# Patient Record
Sex: Female | Born: 1955 | Hispanic: Yes | Marital: Single
Health system: Southern US, Community
[De-identification: ages and names within clinical notes are randomized; demographics above are authoritative.]

## PROBLEM LIST (undated history)

## (undated) VITALS — BP 96/45 | HR 68 | Temp 98.1°F | Resp 19 | Ht 63.98 in | Wt 169.1 lb

---

## 2004-08-27 ENCOUNTER — Ambulatory Visit: Payer: Self-pay | Admitting: General Practice

## 2005-10-01 ENCOUNTER — Ambulatory Visit: Payer: Self-pay | Admitting: General Practice

## 2009-06-06 ENCOUNTER — Inpatient Hospital Stay: Payer: Self-pay | Admitting: Internal Medicine

## 2009-08-05 ENCOUNTER — Inpatient Hospital Stay: Payer: Self-pay | Admitting: Internal Medicine

## 2009-08-16 ENCOUNTER — Inpatient Hospital Stay: Payer: Self-pay | Admitting: Internal Medicine

## 2009-09-06 ENCOUNTER — Ambulatory Visit: Payer: Self-pay | Admitting: Surgery

## 2010-05-13 ENCOUNTER — Emergency Department: Payer: Self-pay | Admitting: Emergency Medicine

## 2011-04-30 ENCOUNTER — Emergency Department: Payer: Self-pay | Admitting: Emergency Medicine

## 2011-04-30 LAB — COMPREHENSIVE METABOLIC PANEL
Albumin: 2.6 g/dL — ABNORMAL LOW (ref 3.4–5.0)
Alkaline Phosphatase: 180 U/L — ABNORMAL HIGH (ref 50–136)
Bilirubin,Total: 1.8 mg/dL — ABNORMAL HIGH (ref 0.2–1.0)
Calcium, Total: 7.8 mg/dL — ABNORMAL LOW (ref 8.5–10.1)
Chloride: 108 mmol/L — ABNORMAL HIGH (ref 98–107)
Creatinine: 0.74 mg/dL (ref 0.60–1.30)
EGFR (African American): >60
EGFR (Non-African Amer.): >60
Glucose: 123 mg/dL — ABNORMAL HIGH (ref 65–99)
Osmolality: 284 (ref 275–301)
Potassium: 4.2 mmol/L (ref 3.5–5.1)
SGOT(AST): 74 U/L — ABNORMAL HIGH (ref 15–37)
Sodium: 142 mmol/L (ref 136–145)
Total Protein: 5.6 g/dL — ABNORMAL LOW (ref 6.4–8.2)

## 2011-04-30 LAB — CBC
HCT: 34 % — ABNORMAL LOW (ref 35.0–47.0)
HGB: 12.1 g/dL (ref 12.0–16.0)
MCHC: 35.5 g/dL (ref 32.0–36.0)
RBC: 3.23 10*6/uL — ABNORMAL LOW (ref 3.80–5.20)
WBC: 4.3 10*3/uL (ref 3.6–11.0)

## 2011-04-30 LAB — TROPONIN I
Troponin-I: <0.02 ng/mL
Troponin-I: 0.02 ng/mL

## 2011-04-30 LAB — CK TOTAL AND CKMB (NOT AT ARMC): CK-MB: 1.1 ng/mL (ref 0.5–3.6)

## 2011-05-01 LAB — AMMONIA: Ammonia, Plasma: 48 mcmol/L — ABNORMAL HIGH (ref 11–32)

## 2012-11-15 ENCOUNTER — Emergency Department: Payer: Self-pay | Admitting: Emergency Medicine

## 2012-11-15 LAB — URINALYSIS, COMPLETE
Specific Gravity: 1.03 (ref 1.003–1.030)
WBC UR: 1 /HPF (ref 0–5)

## 2012-11-15 LAB — COMPREHENSIVE METABOLIC PANEL
Anion Gap: 4 — ABNORMAL LOW (ref 7–16)
BUN: 9 mg/dL (ref 7–18)
Calcium, Total: 7.7 mg/dL — ABNORMAL LOW (ref 8.5–10.1)
Chloride: 107 mmol/L (ref 98–107)
Co2: 26 mmol/L (ref 21–32)
EGFR (African American): >60
EGFR (Non-African Amer.): >60
Osmolality: 276 (ref 275–301)
SGOT(AST): 65 U/L — ABNORMAL HIGH (ref 15–37)
SGPT (ALT): 49 U/L (ref 12–78)
Sodium: 137 mmol/L (ref 136–145)

## 2012-11-15 LAB — CBC
HGB: 12.6 g/dL (ref 12.0–16.0)
MCH: 38.2 pg — ABNORMAL HIGH (ref 26.0–34.0)
MCHC: 35.6 g/dL (ref 32.0–36.0)
MCV: 108 fL — ABNORMAL HIGH (ref 80–100)
Platelet: 74 10*3/uL — ABNORMAL LOW (ref 150–440)
RDW: 15 % — ABNORMAL HIGH (ref 11.5–14.5)

## 2012-11-15 LAB — PROTIME-INR: INR: 1.6

## 2012-11-15 LAB — LIPASE, BLOOD: Lipase: 117 U/L (ref 73–393)

## 2012-11-25 ENCOUNTER — Ambulatory Visit: Payer: Self-pay | Admitting: Unknown Physician Specialty

## 2012-12-10 ENCOUNTER — Emergency Department: Payer: Self-pay | Admitting: Internal Medicine

## 2012-12-10 LAB — PROTIME-INR
INR: 1.7
Prothrombin Time: 20.2 secs — ABNORMAL HIGH (ref 11.5–14.7)

## 2012-12-10 LAB — BASIC METABOLIC PANEL
Anion Gap: 6 — ABNORMAL LOW (ref 7–16)
BUN: 7 mg/dL (ref 7–18)
Co2: 25 mmol/L (ref 21–32)
Creatinine: 0.73 mg/dL (ref 0.60–1.30)
EGFR (African American): >60
EGFR (Non-African Amer.): >60
Glucose: 92 mg/dL (ref 65–99)
Potassium: 3.6 mmol/L (ref 3.5–5.1)

## 2012-12-10 LAB — CK TOTAL AND CKMB (NOT AT ARMC)
CK, Total: 188 U/L (ref 21–215)
CK-MB: 1.1 ng/mL (ref 0.5–3.6)

## 2012-12-10 LAB — CBC
HCT: 33.5 % — ABNORMAL LOW (ref 35.0–47.0)
HGB: 11.8 g/dL — ABNORMAL LOW (ref 12.0–16.0)
MCHC: 35.1 g/dL (ref 32.0–36.0)
RBC: 3.13 10*6/uL — ABNORMAL LOW (ref 3.80–5.20)
RDW: 14.8 % — ABNORMAL HIGH (ref 11.5–14.5)
WBC: 4.2 10*3/uL (ref 3.6–11.0)

## 2012-12-10 LAB — TROPONIN I: Troponin-I: <0.02 ng/mL

## 2012-12-21 ENCOUNTER — Emergency Department: Payer: Self-pay | Admitting: Emergency Medicine

## 2012-12-21 LAB — CBC
HCT: 39.3 % (ref 35.0–47.0)
HGB: 13.7 g/dL (ref 12.0–16.0)
MCH: 37 pg — ABNORMAL HIGH (ref 26.0–34.0)
MCHC: 34.9 g/dL (ref 32.0–36.0)
MCV: 106 fL — ABNORMAL HIGH (ref 80–100)
RBC: 3.7 10*6/uL — ABNORMAL LOW (ref 3.80–5.20)
WBC: 6.4 10*3/uL (ref 3.6–11.0)

## 2012-12-21 LAB — COMPREHENSIVE METABOLIC PANEL
Alkaline Phosphatase: 303 U/L — ABNORMAL HIGH (ref 50–136)
Anion Gap: 7 (ref 7–16)
Bilirubin,Total: 3.6 mg/dL — ABNORMAL HIGH (ref 0.2–1.0)
Calcium, Total: 8.1 mg/dL — ABNORMAL LOW (ref 8.5–10.1)
Chloride: 102 mmol/L (ref 98–107)
Co2: 23 mmol/L (ref 21–32)
Creatinine: 1.17 mg/dL (ref 0.60–1.30)
EGFR (Non-African Amer.): 51 — ABNORMAL LOW
Osmolality: 270 (ref 275–301)
SGOT(AST): 95 U/L — ABNORMAL HIGH (ref 15–37)
Sodium: 132 mmol/L — ABNORMAL LOW (ref 136–145)
Total Protein: 6.8 g/dL (ref 6.4–8.2)

## 2012-12-21 LAB — URINALYSIS, COMPLETE
Bilirubin,UR: NEGATIVE
Blood: NEGATIVE
Glucose,UR: >=500 mg/dL (ref 0–75)
Ketone: NEGATIVE
Leukocyte Esterase: NEGATIVE
Ph: 6 (ref 4.5–8.0)
RBC,UR: 2 /HPF (ref 0–5)
Specific Gravity: 1.03 (ref 1.003–1.030)
Squamous Epithelial: 2
WBC UR: <1 /HPF (ref 0–5)

## 2012-12-21 LAB — LIPASE, BLOOD: Lipase: 203 U/L (ref 73–393)

## 2012-12-24 ENCOUNTER — Emergency Department: Payer: Self-pay | Admitting: Emergency Medicine

## 2012-12-24 LAB — CBC
HCT: 37.3 % (ref 35.0–47.0)
MCHC: 35.1 g/dL (ref 32.0–36.0)
MCV: 106 fL — ABNORMAL HIGH (ref 80–100)
RDW: 14.5 % (ref 11.5–14.5)
WBC: 5.8 10*3/uL (ref 3.6–11.0)

## 2012-12-24 LAB — URINALYSIS, COMPLETE
Glucose,UR: >=500 mg/dL (ref 0–75)
Leukocyte Esterase: NEGATIVE
Nitrite: NEGATIVE
Ph: 6 (ref 4.5–8.0)
RBC,UR: 3 /HPF (ref 0–5)
Specific Gravity: 1.018 (ref 1.003–1.030)
WBC UR: 2 /HPF (ref 0–5)

## 2012-12-24 LAB — COMPREHENSIVE METABOLIC PANEL
Albumin: 2.4 g/dL — ABNORMAL LOW (ref 3.4–5.0)
Anion Gap: 7 (ref 7–16)
BUN: 8 mg/dL (ref 7–18)
Calcium, Total: 8 mg/dL — ABNORMAL LOW (ref 8.5–10.1)
Chloride: 106 mmol/L (ref 98–107)
Creatinine: 0.98 mg/dL (ref 0.60–1.30)
EGFR (Non-African Amer.): >60
Potassium: 3.6 mmol/L (ref 3.5–5.1)
Sodium: 136 mmol/L (ref 136–145)

## 2013-04-05 ENCOUNTER — Emergency Department: Payer: Self-pay | Admitting: Emergency Medicine

## 2013-04-06 LAB — CBC WITH DIFFERENTIAL/PLATELET
BASOS PCT: 1.2 %
Basophil #: 0.1 10*3/uL (ref 0.0–0.1)
Eosinophil #: 0.1 10*3/uL (ref 0.0–0.7)
Eosinophil %: 2.2 %
HCT: 37.3 % (ref 35.0–47.0)
HGB: 12.8 g/dL (ref 12.0–16.0)
Lymphocyte #: 1 10*3/uL (ref 1.0–3.6)
Lymphocyte %: 18.7 %
MCH: 37 pg — ABNORMAL HIGH (ref 26.0–34.0)
MCHC: 34.2 g/dL (ref 32.0–36.0)
MCV: 108 fL — AB (ref 80–100)
MONO ABS: 1 x10 3/mm — AB (ref 0.2–0.9)
Monocyte %: 19.4 %
NEUTROS ABS: 3 10*3/uL (ref 1.4–6.5)
Neutrophil %: 58.5 %
PLATELETS: 65 10*3/uL — AB (ref 150–440)
RBC: 3.44 10*6/uL — ABNORMAL LOW (ref 3.80–5.20)
RDW: 15.5 % — ABNORMAL HIGH (ref 11.5–14.5)
WBC: 5.2 10*3/uL (ref 3.6–11.0)

## 2013-04-06 LAB — COMPREHENSIVE METABOLIC PANEL
ALT: 65 U/L (ref 12–78)
Albumin: 2.5 g/dL — ABNORMAL LOW (ref 3.4–5.0)
Alkaline Phosphatase: 368 U/L — ABNORMAL HIGH
Anion Gap: 4 — ABNORMAL LOW (ref 7–16)
BUN: 9 mg/dL (ref 7–18)
Bilirubin,Total: 2.8 mg/dL — ABNORMAL HIGH (ref 0.2–1.0)
CREATININE: 0.69 mg/dL (ref 0.60–1.30)
Calcium, Total: 7.9 mg/dL — ABNORMAL LOW (ref 8.5–10.1)
Chloride: 104 mmol/L (ref 98–107)
Co2: 29 mmol/L (ref 21–32)
EGFR (African American): >60
Glucose: 106 mg/dL — ABNORMAL HIGH (ref 65–99)
OSMOLALITY: 273 (ref 275–301)
Potassium: 4.5 mmol/L (ref 3.5–5.1)
SGOT(AST): 90 U/L — ABNORMAL HIGH (ref 15–37)
Sodium: 137 mmol/L (ref 136–145)
Total Protein: 6.4 g/dL (ref 6.4–8.2)

## 2013-04-06 LAB — MAGNESIUM: MAGNESIUM: 1.8 mg/dL

## 2013-04-06 LAB — CK: CK, Total: 183 U/L (ref 21–215)

## 2013-04-10 ENCOUNTER — Ambulatory Visit: Payer: Self-pay | Admitting: Unknown Physician Specialty

## 2013-04-11 ENCOUNTER — Ambulatory Visit: Payer: Self-pay | Admitting: Unknown Physician Specialty

## 2013-09-17 ENCOUNTER — Inpatient Hospital Stay: Payer: Self-pay | Admitting: Internal Medicine

## 2013-09-17 LAB — COMPREHENSIVE METABOLIC PANEL
ALBUMIN: 2.3 g/dL — AB (ref 3.4–5.0)
ALK PHOS: 393 U/L — AB
Anion Gap: 8 (ref 7–16)
BUN: 14 mg/dL (ref 7–18)
Bilirubin,Total: 3.6 mg/dL — ABNORMAL HIGH (ref 0.2–1.0)
CREATININE: 0.69 mg/dL (ref 0.60–1.30)
Calcium, Total: 7.7 mg/dL — ABNORMAL LOW (ref 8.5–10.1)
Chloride: 104 mmol/L (ref 98–107)
Co2: 21 mmol/L (ref 21–32)
EGFR (African American): >60
EGFR (Non-African Amer.): >60
Glucose: 133 mg/dL — ABNORMAL HIGH (ref 65–99)
Osmolality: 269 (ref 275–301)
Potassium: 4.6 mmol/L (ref 3.5–5.1)
SGOT(AST): 92 U/L — ABNORMAL HIGH (ref 15–37)
SGPT (ALT): 72 U/L (ref 12–78)
Sodium: 133 mmol/L — ABNORMAL LOW (ref 136–145)
TOTAL PROTEIN: 6.1 g/dL — AB (ref 6.4–8.2)

## 2013-09-17 LAB — URINALYSIS, COMPLETE
BLOOD: NEGATIVE
Glucose,UR: 50 mg/dL (ref 0–75)
Leukocyte Esterase: NEGATIVE
NITRITE: NEGATIVE
PH: 5 (ref 4.5–8.0)
Protein: 30
Specific Gravity: 1.035 (ref 1.003–1.030)

## 2013-09-17 LAB — ACETAMINOPHEN LEVEL: Acetaminophen: <2 ug/mL

## 2013-09-17 LAB — AMMONIA: AMMONIA, PLASMA: 70 umol/L — AB (ref 11–32)

## 2013-09-17 LAB — ETHANOL
Ethanol %: <0.003 % (ref 0.000–0.080)
Ethanol: <3 mg/dL

## 2013-09-17 LAB — CBC
HCT: 37.6 % (ref 35.0–47.0)
HGB: 12.8 g/dL (ref 12.0–16.0)
MCH: 37.7 pg — ABNORMAL HIGH (ref 26.0–34.0)
MCHC: 34 g/dL (ref 32.0–36.0)
MCV: 111 fL — ABNORMAL HIGH (ref 80–100)
Platelet: 65 10*3/uL — ABNORMAL LOW (ref 150–440)
RBC: 3.39 10*6/uL — AB (ref 3.80–5.20)
RDW: 15.5 % — ABNORMAL HIGH (ref 11.5–14.5)
WBC: 10.8 10*3/uL (ref 3.6–11.0)

## 2013-09-17 LAB — SALICYLATE LEVEL: Salicylates, Serum: <1.7 mg/dL

## 2013-09-17 LAB — LIPASE, BLOOD: Lipase: 173 U/L (ref 73–393)

## 2013-09-18 LAB — CBC WITH DIFFERENTIAL/PLATELET
BASOS PCT: 0.4 %
Basophil #: 0 10*3/uL (ref 0.0–0.1)
Eosinophil #: 0.1 10*3/uL (ref 0.0–0.7)
Eosinophil %: 1.4 %
HCT: 30.7 % — AB (ref 35.0–47.0)
HGB: 10.8 g/dL — ABNORMAL LOW (ref 12.0–16.0)
LYMPHS ABS: 0.7 10*3/uL — AB (ref 1.0–3.6)
Lymphocyte %: 10.3 %
MCH: 39.5 pg — AB (ref 26.0–34.0)
MCHC: 35.1 g/dL (ref 32.0–36.0)
MCV: 113 fL — AB (ref 80–100)
MONO ABS: 1 x10 3/mm — AB (ref 0.2–0.9)
MONOS PCT: 14.6 %
NEUTROS ABS: 5 10*3/uL (ref 1.4–6.5)
NEUTROS PCT: 73.3 %
PLATELETS: 43 10*3/uL — AB (ref 150–440)
RBC: 2.72 10*6/uL — ABNORMAL LOW (ref 3.80–5.20)
RDW: 15.7 % — ABNORMAL HIGH (ref 11.5–14.5)
WBC: 6.8 10*3/uL (ref 3.6–11.0)

## 2013-09-18 LAB — COMPREHENSIVE METABOLIC PANEL
ANION GAP: 6 — AB (ref 7–16)
Albumin: 1.8 g/dL — ABNORMAL LOW (ref 3.4–5.0)
Alkaline Phosphatase: 190 U/L — ABNORMAL HIGH
BILIRUBIN TOTAL: 4.7 mg/dL — AB (ref 0.2–1.0)
BUN: 11 mg/dL (ref 7–18)
CALCIUM: 7.5 mg/dL — AB (ref 8.5–10.1)
CHLORIDE: 111 mmol/L — AB (ref 98–107)
CO2: 25 mmol/L (ref 21–32)
Creatinine: 0.78 mg/dL (ref 0.60–1.30)
EGFR (African American): >60
EGFR (Non-African Amer.): >60
Glucose: 115 mg/dL — ABNORMAL HIGH (ref 65–99)
OSMOLALITY: 283 (ref 275–301)
POTASSIUM: 4.1 mmol/L (ref 3.5–5.1)
SGOT(AST): 70 U/L — ABNORMAL HIGH (ref 15–37)
SGPT (ALT): 59 U/L (ref 12–78)
SODIUM: 142 mmol/L (ref 136–145)
Total Protein: 4.8 g/dL — ABNORMAL LOW (ref 6.4–8.2)

## 2013-09-18 LAB — MAGNESIUM: MAGNESIUM: 1.7 mg/dL — AB

## 2013-09-19 LAB — VANCOMYCIN, TROUGH: Vancomycin, Trough: 9 ug/mL — ABNORMAL LOW (ref 10–20)

## 2013-09-20 DIAGNOSIS — I519 Heart disease, unspecified: Secondary | ICD-10-CM

## 2013-09-20 LAB — CBC WITH DIFFERENTIAL/PLATELET
BASOS PCT: 0.2 %
Basophil #: 0 10*3/uL (ref 0.0–0.1)
Basophil #: 0.1 10*3/uL (ref 0.0–0.1)
Basophil %: 0.9 %
EOS PCT: 1.8 %
Eosinophil #: 0 10*3/uL (ref 0.0–0.7)
Eosinophil #: 0.1 10*3/uL (ref 0.0–0.7)
Eosinophil %: 0.2 %
HCT: 27.4 % — AB (ref 35.0–47.0)
HCT: 24.7 % — ABNORMAL LOW (ref 35.0–47.0)
HGB: 9.3 g/dL — AB (ref 12.0–16.0)
HGB: 8.4 g/dL — AB (ref 12.0–16.0)
LYMPHS ABS: 1.1 10*3/uL (ref 1.0–3.6)
LYMPHS ABS: 1.2 10*3/uL (ref 1.0–3.6)
LYMPHS PCT: 13.6 %
Lymphocyte %: 14.5 %
MCH: 38.3 pg — AB (ref 26.0–34.0)
MCH: 37.9 pg — ABNORMAL HIGH (ref 26.0–34.0)
MCHC: 34 g/dL (ref 32.0–36.0)
MCHC: 33.8 g/dL (ref 32.0–36.0)
MCV: 113 fL — ABNORMAL HIGH (ref 80–100)
MCV: 112 fL — ABNORMAL HIGH (ref 80–100)
MONO ABS: 1.4 x10 3/mm — AB (ref 0.2–0.9)
MONOS PCT: 17.5 %
Monocyte #: 1.1 x10 3/mm — ABNORMAL HIGH (ref 0.2–0.9)
Monocyte %: 13.7 %
NEUTROS PCT: 69.1 %
Neutrophil #: 5.3 10*3/uL (ref 1.4–6.5)
Neutrophil #: 5.6 10*3/uL (ref 1.4–6.5)
Neutrophil %: 68.5 %
PLATELETS: 88 10*3/uL — AB (ref 150–440)
Platelet: 85 10*3/uL — ABNORMAL LOW (ref 150–440)
RBC: 2.44 10*6/uL — ABNORMAL LOW (ref 3.80–5.20)
RBC: 2.2 10*6/uL — ABNORMAL LOW (ref 3.80–5.20)
RDW: 15.2 % — ABNORMAL HIGH (ref 11.5–14.5)
RDW: 15.8 % — ABNORMAL HIGH (ref 11.5–14.5)
WBC: 7.8 10*3/uL (ref 3.6–11.0)
WBC: 8.1 10*3/uL (ref 3.6–11.0)

## 2013-09-20 LAB — BASIC METABOLIC PANEL
ANION GAP: 6 — AB (ref 7–16)
BUN: 23 mg/dL — ABNORMAL HIGH (ref 7–18)
CALCIUM: 7.5 mg/dL — AB (ref 8.5–10.1)
CHLORIDE: 108 mmol/L — AB (ref 98–107)
Co2: 27 mmol/L (ref 21–32)
Creatinine: 0.81 mg/dL (ref 0.60–1.30)
EGFR (Non-African Amer.): >60
Glucose: 178 mg/dL — ABNORMAL HIGH (ref 65–99)
OSMOLALITY: 289 (ref 275–301)
Potassium: 4.8 mmol/L (ref 3.5–5.1)
Sodium: 141 mmol/L (ref 136–145)

## 2013-09-20 LAB — PROTIME-INR
INR: 1.9
PROTHROMBIN TIME: 21.3 s — AB (ref 11.5–14.7)

## 2013-09-20 LAB — RAPID HIV SCREEN (HIV 1/2 AB+AG)

## 2013-09-20 LAB — HEMOGLOBIN: HGB: 9.4 g/dL — ABNORMAL LOW (ref 12.0–16.0)

## 2013-09-21 LAB — HEMOGLOBIN
HGB: 7.4 g/dL — AB (ref 12.0–16.0)
HGB: 6.9 g/dL — ABNORMAL LOW (ref 12.0–16.0)

## 2013-09-21 LAB — CULTURE, BLOOD (SINGLE)

## 2013-09-22 LAB — BASIC METABOLIC PANEL
Anion Gap: 6 — ABNORMAL LOW (ref 7–16)
BUN: 13 mg/dL (ref 7–18)
CO2: 24 mmol/L (ref 21–32)
CREATININE: 0.73 mg/dL (ref 0.60–1.30)
Calcium, Total: 6.6 mg/dL — CL (ref 8.5–10.1)
Chloride: 111 mmol/L — ABNORMAL HIGH (ref 98–107)
EGFR (African American): >60
EGFR (Non-African Amer.): >60
GLUCOSE: 93 mg/dL (ref 65–99)
Osmolality: 281 (ref 275–301)
POTASSIUM: 3.9 mmol/L (ref 3.5–5.1)
Sodium: 141 mmol/L (ref 136–145)

## 2013-09-22 LAB — CBC WITH DIFFERENTIAL/PLATELET
Basophil #: 0 10*3/uL (ref 0.0–0.1)
Basophil %: 0.9 %
EOS PCT: 4.1 %
Eosinophil #: 0.1 10*3/uL (ref 0.0–0.7)
HCT: 22.9 % — ABNORMAL LOW (ref 35.0–47.0)
HGB: 7.9 g/dL — AB (ref 12.0–16.0)
LYMPHS PCT: 29 %
Lymphocyte #: 0.9 10*3/uL — ABNORMAL LOW (ref 1.0–3.6)
MCH: 37.9 pg — ABNORMAL HIGH (ref 26.0–34.0)
MCHC: 34.5 g/dL (ref 32.0–36.0)
MCV: 110 fL — ABNORMAL HIGH (ref 80–100)
Monocyte #: 0.6 x10 3/mm (ref 0.2–0.9)
Monocyte %: 18.9 %
Neutrophil #: 1.5 10*3/uL (ref 1.4–6.5)
Neutrophil %: 47.1 %
Platelet: 43 10*3/uL — ABNORMAL LOW (ref 150–440)
RBC: 2.08 10*6/uL — ABNORMAL LOW (ref 3.80–5.20)
RDW: 19.2 % — ABNORMAL HIGH (ref 11.5–14.5)
WBC: 3.3 10*3/uL — ABNORMAL LOW (ref 3.6–11.0)

## 2013-09-22 LAB — HEPATIC FUNCTION PANEL A (ARMC)
Albumin: 1.5 g/dL — ABNORMAL LOW (ref 3.4–5.0)
Alkaline Phosphatase: 126 U/L — ABNORMAL HIGH
Bilirubin, Direct: 1.7 mg/dL — ABNORMAL HIGH (ref 0.00–0.20)
Bilirubin,Total: 3 mg/dL — ABNORMAL HIGH (ref 0.2–1.0)
SGOT(AST): 92 U/L — ABNORMAL HIGH (ref 15–37)
SGPT (ALT): 61 U/L (ref 12–78)
Total Protein: 3.8 g/dL — ABNORMAL LOW (ref 6.4–8.2)

## 2013-09-22 LAB — HEMOGLOBIN: HGB: 9.1 g/dL — ABNORMAL LOW (ref 12.0–16.0)

## 2013-09-24 LAB — COMPREHENSIVE METABOLIC PANEL
ALK PHOS: 151 U/L — AB
ALT: 60 U/L (ref 12–78)
Albumin: 1.7 g/dL — ABNORMAL LOW (ref 3.4–5.0)
Anion Gap: 6 — ABNORMAL LOW (ref 7–16)
BUN: 13 mg/dL (ref 7–18)
Bilirubin,Total: 2.5 mg/dL — ABNORMAL HIGH (ref 0.2–1.0)
CALCIUM: 7 mg/dL — AB (ref 8.5–10.1)
CO2: 25 mmol/L (ref 21–32)
Chloride: 111 mmol/L — ABNORMAL HIGH (ref 98–107)
Creatinine: 0.82 mg/dL (ref 0.60–1.30)
EGFR (Non-African Amer.): >60
GLUCOSE: 82 mg/dL (ref 65–99)
Osmolality: 282 (ref 275–301)
Potassium: 3.8 mmol/L (ref 3.5–5.1)
SGOT(AST): 102 U/L — ABNORMAL HIGH (ref 15–37)
SODIUM: 142 mmol/L (ref 136–145)
Total Protein: 4.3 g/dL — ABNORMAL LOW (ref 6.4–8.2)

## 2013-09-24 LAB — CBC WITH DIFFERENTIAL/PLATELET
BASOS ABS: 0.1 10*3/uL (ref 0.0–0.1)
BASOS PCT: 1.2 %
Eosinophil #: 0.2 10*3/uL (ref 0.0–0.7)
Eosinophil %: 3.9 %
HCT: 26.2 % — ABNORMAL LOW (ref 35.0–47.0)
HGB: 9 g/dL — ABNORMAL LOW (ref 12.0–16.0)
Lymphocyte #: 1.4 10*3/uL (ref 1.0–3.6)
Lymphocyte %: 29.9 %
MCH: 37.4 pg — ABNORMAL HIGH (ref 26.0–34.0)
MCHC: 34.5 g/dL (ref 32.0–36.0)
MCV: 108 fL — AB (ref 80–100)
MONO ABS: 0.8 x10 3/mm (ref 0.2–0.9)
Monocyte %: 18.4 %
Neutrophil #: 2.1 10*3/uL (ref 1.4–6.5)
Neutrophil %: 46.6 %
Platelet: 67 10*3/uL — ABNORMAL LOW (ref 150–440)
RBC: 2.42 10*6/uL — AB (ref 3.80–5.20)
RDW: 18.7 % — ABNORMAL HIGH (ref 11.5–14.5)
WBC: 4.6 10*3/uL (ref 3.6–11.0)

## 2013-09-24 LAB — CULTURE, BLOOD (SINGLE)

## 2013-09-25 LAB — CBC WITH DIFFERENTIAL/PLATELET
Basophil #: 0.1 10*3/uL (ref 0.0–0.1)
Basophil %: 1.1 %
EOS PCT: 4.1 %
Eosinophil #: 0.2 10*3/uL (ref 0.0–0.7)
HCT: 26.8 % — AB (ref 35.0–47.0)
HGB: 9.2 g/dL — ABNORMAL LOW (ref 12.0–16.0)
LYMPHS PCT: 23.5 %
Lymphocyte #: 1.1 10*3/uL (ref 1.0–3.6)
MCH: 37.5 pg — ABNORMAL HIGH (ref 26.0–34.0)
MCHC: 34.3 g/dL (ref 32.0–36.0)
MCV: 110 fL — ABNORMAL HIGH (ref 80–100)
MONO ABS: 1 x10 3/mm — AB (ref 0.2–0.9)
MONOS PCT: 20.8 %
Neutrophil #: 2.3 10*3/uL (ref 1.4–6.5)
Neutrophil %: 50.5 %
PLATELETS: 72 10*3/uL — AB (ref 150–440)
RBC: 2.45 10*6/uL — ABNORMAL LOW (ref 3.80–5.20)
RDW: 18.9 % — AB (ref 11.5–14.5)
WBC: 4.6 10*3/uL (ref 3.6–11.0)

## 2013-09-26 ENCOUNTER — Ambulatory Visit (HOSPITAL_COMMUNITY)
Admission: AD | Admit: 2013-09-26 | Discharge: 2013-09-26 | Disposition: A | Discharge status: Home / Self Care | Payer: Self-pay | Source: Other Acute Inpatient Hospital | Attending: Internal Medicine | Admitting: Internal Medicine

## 2013-09-26 DIAGNOSIS — A419 Sepsis, unspecified organism: Secondary | ICD-10-CM | POA: Insufficient documentation

## 2014-06-27 ENCOUNTER — Emergency Department
Admit: 2014-06-27 | Disposition: A | Discharge status: Other Acute Inpatient Hospital | Payer: Self-pay | Admitting: Emergency Medicine

## 2014-06-27 LAB — COMPREHENSIVE METABOLIC PANEL
ALBUMIN: 2.4 g/dL — AB
ALT: 54 U/L
Alkaline Phosphatase: 238 U/L — ABNORMAL HIGH
Anion Gap: 7 (ref 7–16)
BUN: 22 mg/dL — ABNORMAL HIGH
Bilirubin,Total: 3.4 mg/dL — ABNORMAL HIGH
CHLORIDE: 101 mmol/L
Calcium, Total: 8.3 mg/dL — ABNORMAL LOW
Co2: 25 mmol/L
Creatinine: 1.08 mg/dL — ABNORMAL HIGH
EGFR (African American): >60
EGFR (Non-African Amer.): 56 — ABNORMAL LOW
GLUCOSE: 126 mg/dL — AB
Potassium: 3.8 mmol/L
SGOT(AST): 79 U/L — ABNORMAL HIGH
SODIUM: 133 mmol/L — AB
Total Protein: 6.3 g/dL — ABNORMAL LOW

## 2014-06-27 LAB — CBC
HCT: 33.1 % — AB (ref 35.0–47.0)
HGB: 11 g/dL — AB (ref 12.0–16.0)
MCH: 32.4 pg (ref 26.0–34.0)
MCHC: 33.3 g/dL (ref 32.0–36.0)
MCV: 97 fL (ref 80–100)
Platelet: 53 10*3/uL — ABNORMAL LOW (ref 150–440)
RBC: 3.4 10*6/uL — AB (ref 3.80–5.20)
RDW: 19.6 % — ABNORMAL HIGH (ref 11.5–14.5)
WBC: 6.9 10*3/uL (ref 3.6–11.0)

## 2014-06-27 LAB — URINALYSIS, COMPLETE
BACTERIA: NONE SEEN
BILIRUBIN, UR: NEGATIVE
GLUCOSE, UR: NEGATIVE mg/dL (ref 0–75)
KETONE: NEGATIVE
Leukocyte Esterase: NEGATIVE
Nitrite: NEGATIVE
Ph: 6 (ref 4.5–8.0)
Protein: NEGATIVE
Specific Gravity: 1.021 (ref 1.003–1.030)

## 2014-06-27 LAB — APTT: Activated PTT: 30.3 secs (ref 23.6–35.9)

## 2014-06-27 LAB — ETHANOL: Ethanol: <5 mg/dL

## 2014-06-27 LAB — PROTIME-INR
INR: 1.5
PROTHROMBIN TIME: 18.3 s — AB

## 2014-06-27 LAB — TROPONIN I: Troponin-I: <0.03 ng/mL

## 2014-06-27 LAB — AMMONIA: AMMONIA, PLASMA: 73 umol/L — AB

## 2014-07-02 LAB — CULTURE, BLOOD (SINGLE)

## 2014-07-07 NOTE — Consult Note (Signed)
Pt with acute onset chills, nausea, vomiting, presented to ER and clinical dx made of possible SBP given her cirrhosis and ascites.  She was started on iv 3rd generation cephalosporin and now feels much better.  Would send her home on antibiotics when discharged.  No new suggestions.  Electronic Signatures: Scot JunElliott, Robert T (MD)  (Signed on 05-Jul-15 13:33)  Authored  Last Updated: 05-Jul-15 13:33 by Scot JunElliott, Robert T (MD)

## 2014-07-07 NOTE — H&P (Signed)
PATIENT NAME:  Annette Howard, Annette Howard MR#:  [id number] DATE OF BIRTH:  [birth year]  DATE OF ADMISSION:  09/17/2013.  PRIMARY CARE PHYSICIAN:  Nonlocal.   REFERRING PHYSICIAN:  Jade J. Dolores Frame, MD.  CHIEF COMPLAINT: Fever, body aches, vomiting, myalgias and epigastric pain.   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  She is presenting to the ED with a chief complaint of feeling sick, associated with chills and rigors and myalgias. The patient is also complaining of generalized abdominal pain, mostly located in the epigastric region, associated with clear liquid vomiting. She denies any hematemesis or melena. Denies any diarrhea. No sick people around.  Denies any similar complaints in the past. The patient had a paracentesis done in the past but she could not recall when it was done. The patient is brought into the ED, her ammonia levels are elevated. Lactulose was given and there was a concern about spontaneous bacterial peritonitis as the patient has liver cirrhosis along with some myalgias and elevated lactic acid, complaining of epigastric abdominal pain associated with vomiting.   Blood cultures were obtained and IV antibiotics were given.  The hospitalist team was called to admit the patient. With the help of a Spanish interpreter, Annette Howard, history was obtained from the patient. Her son is at the bedside. The patient is not confused and answering all questions accurately. One dose of lactulose was given in the ED.   PAST MEDICAL HISTORY: Chronic cirrhosis and anemia. Chronic neck pain. Gastric ulcers. Chronic anemia.   PAST SURGICAL HISTORY: Total hysterectomy and eye surgery.    ALLERGIES:  No known drug allergies.   PSYCHOSOCIAL HISTORY: Lives at home with her son. No history of smoking, alcohol or illicit drug usage.  HOME  MEDICATIONS: Spironolactone 50 mg once daily, oxycodone 5 mg 1 tablet p.o. q.4-6 hours.  FAMILY HISTORY: Parents were healthy, no significant medical problems.    REVIEW OF SYSTEMS:   CONSTITUTIONAL:  Complaining of, chills, rigors, and body aches, no weight loss or weight gain.  EYES: Denies blurry vision, double vision.  Positive jaundice, +2. No cataracts or glaucoma.  ENT: Denies any epistaxis, discharge, tinnitus.  RESPIRATORY:  Denies cough, COPD, no shortness of breath.  CARDIOVASCULAR: Denies any chest pain, palpitations, dizziness. GASTROINTESTINAL:  Complaining of nausea and vomiting x4, denies any hematemesis or melena. Complaining of epigastric abdominal pain.  NEUROLOGICAL: Denies any vertigo, ataxia, strokes or TIAs.   ENDOCRINE: Denies polyuria, nocturia, thyroid problems.  HEMATOLOGIC AND LYMPHATIC:  Has chronic anemia. Denies any bruising or bleeding.   MUSCULOSKELETAL: Denies any back pain. Complaining of chronic neck pain. Denies any shoulder pain or gout.  PSYCHIATRIC: Flat mood and affect.    PHYSICAL EXAMINATION:  VITAL SIGNS: Temperature 99.2, pulse 106, respirations 20, blood pressure 134/52, oxygen saturation is 98%.  GENERAL APPEARANCE: Not in acute distress. Moderately built and nourished.  HEENT: Normocephalic, atraumatic. Pupils are equal, reacting to light and accommodation. Positive scleral icterus. No conjunctival injection. Extraocular movements are intact. No sinus tenderness. Moist mucous membranes.  NECK: Supple. No JVD. No thyromegaly. Range of motion is intact. No meningeal signs.  LUNGS: Clear to auscultation bilaterally. No accessory muscle use and no anterior chest wall tenderness on palpation.  CARDIAC: S1 and S2 normal. Regular rate and rhythm, tachycardic.  GASTROINTESTINAL: Soft. Bowel sounds are positive in all 4 quadrants. Positive epigastric tenderness.  No rebound tenderness. No masses felt.  NEUROLOGIC: Awake, alert and oriented times x3.  Cranial nerves II-XII are grossly intact. Motor and sensory are intact. Reflexes are 2+.  EXTREMITIES: No edema. No cyanosis. No clubbing.  SKIN: Warm to touch. Normal turgor. No rashes. No lesions.  MUSCULOSKELETAL: No joint effusion, tenderness, erythema.  PSYCHIATRIC: Normal mood and affect.   LABORATORY AND IMAGING STUDIES:  A 12-lead EKG: Sinus tachycardia at 111 beats per minute, left atrial enlargement.    LFTs: Total protein 6.1, albumin 2.3, bilirubin total 3.6, alkaline phosphatase 93, AST 92, ALT 72, glucose 133, BUN and creatinine are normal. Sodium 133 potassium chloride and CO2 are normal. GFR greater than 60. Anion gap is 8. Serum osmolality is normal, calcium 7.7, lipase 173, ammonia level is 70. Serum alcohol level is less than 3. WBC 10.8, hemoglobin 12.8, hematocrit is 37.6, platelets are 265,000, MCV 111.   Urinalysis: Amber color, hazy in appearance. Bilirubin 1+, nitrites and leukocyte esterase are negative. Acetaminophen level is less than 2,  salicylate less than 1.7, lactic acid 1.4.   Portable chest x-ray: Slight right-sided pleural effusion which could obscure atelectasis or pneumonia.   ASSESSMENT AND PLAN: At 59 year old Hispanic female who came into the ED with a chief complaint of chills and rigors, nausea and vomiting associated with muscle aches and feeling sick.  She will be admitted with the following assessment and plan:  1.  Sepsis with elevated lactic acid, possibly spontaneous bacterial peritonitis. Pancultures were obtained, IV levofloxacin was given in the ED. Gastroenterology consult was placed. We will continue cefotaxime 2 grams IV q.8 hours for empiric coverage for spontaneous bacterial peritonitis.  Will provide the patient a clear liquid diet and pain management on an as-needed basis.  Deferring paracentesis based on GI recommendations.  2.  History of gastric ulcer. We will provide ranitidine.  The patient is on clear liquid diet at this time.  3.   Chronic history of cirrhosis, probably from hepatitis C, elevated ammonia level, but the patient's mentation is normal. We will provide lactulose.  4.  Chronic history of anemia and thrombocytopenia.  Will avoid antiplatelet agents. We will provide her with SCDs for deep vein thrombosis prophylaxis. 5.  Chronic neck pain. No photophobia or meningeal signs.  Range of motion of the neck is intact. Will provide pain management on as-needed basis. Provide GI and DVT prophylaxis with ranitidine and SCDs.   CODE STATUS:  Full code.  Her son is her medical power of attorney.   Her history and physical and physical examination was done with the help of Spanish interpreter Ms. Trudy.  Plan of care discussed with the patient and her son at bedside. They both verbalized understanding of the plan.   TOTAL TIME SPENT ON ADMISSION: 50 minutes.    ____________________________ Ramonita LabAruna Leon Goodnow, MD ag:lt D: 09/17/2013 08:00:19 ET T: 09/17/2013 09:00:56 ET JOB#: [id number]  cc: Ramonita LabAruna Mylisa Brunson, MD, <Dictator> Ramonita LabARUNA Zaila Crew MD ELECTRONICALLY SIGNED 09/30/2013 2:18

## 2014-07-07 NOTE — Consult Note (Signed)
Chief Complaint:  Subjective/Chief Complaint Patient without any complaints today. Denies any sign of further bleeding. Denies any abd pain.   Brief Assessment:  GEN well developed, well nourished, no acute distress, Jaundice   Cardiac Regular   Respiratory normal resp effort   Gastrointestinal Normal   Lab Results: Hepatic:  10-Jul-15 04:19   Bilirubin, Total  3.0  Bilirubin, Direct  1.7 (Result(s) reported on 22 Sep 2013 at 07:23AM.)  Alkaline Phosphatase  126 (45-117 NOTE: New Reference Range 02/03/13)  SGPT (ALT) 61  SGOT (AST)  92  Total Protein, Serum  3.8  Albumin, Serum  1.5  Routine Chem:  10-Jul-15 04:19   Glucose, Serum 93  BUN 13  Creatinine (comp) 0.73  Sodium, Serum 141  Potassium, Serum 3.9  Chloride, Serum  111  CO2, Serum 24  Calcium (Total), Serum  6.6  Anion Gap  6  Osmolality (calc) 281  eGFR (African American) >60  Result Comment CALCIUM - RESULTS VERIFIED BY REPEAT TESTING.  - NOTIFIED OF CRITICAL VALUE  - C/ TERESA TAPP $[id number]'[id number]$  09-22-13.Marland KitchenAJO  - READ-BACK PROCESS PERFORMED. LABS - This specimen was collected through an   - indwelling catheter or arterial line.  - A minimum of 70mls of blood was wasted prior    - to collecting the sample.  Interpret  - results with caution.  Result(s) reported on 22 Sep 2013 at 04:58AM.  Routine Hem:  10-Jul-15 04:19   Hemoglobin (CBC)  7.9  WBC (CBC)  3.3  RBC (CBC)  2.08  Hematocrit (CBC)  22.9  Platelet Count (CBC)  43  MCV  110  MCH  37.9  MCHC 34.5  RDW  19.2  Neutrophil % 47.1  Lymphocyte % 29.0  Monocyte % 18.9  Eosinophil % 4.1  Basophil % 0.9  Neutrophil # 1.5  Lymphocyte #  0.9  Monocyte # 0.6  Eosinophil # 0.1  Basophil # 0.0 (Result(s) reported on 22 Sep 2013 at 04:58AM.)    12:34   Hemoglobin (CBC)  9.1 (Result(s) reported on 22 Sep 2013 at 01:00PM.)   Assessment/Plan:  Assessment/Plan:  Assessment Hep C cirrhosis with variceal bleeding. Hb stable. No sign of any active  bleeding.   Plan Continue supportive care. Follow Hb.   Electronic Signatures: Lucilla Lame (MD)  (Signed 11-Jul-15 09:33)  Authored: Chief Complaint, Brief Assessment, Lab Results, Assessment/Plan   Last Updated: 11-Jul-15 09:33 by Lucilla Lame (MD)

## 2014-07-07 NOTE — Consult Note (Signed)
Pt with drop in hgb this morning and vomited small amt of blood last nite.  Had lot of dark blood in bowels today.  Hgb repeated at 4pm  showed no change since today at 10am.  Chest clear, heart no murmurs, mild tachycardia, abd not distended.  Plan to do EGD in morning, continue octreotide drip.  Electronic Signatures: Scot JunElliott, Elena Davia T (MD)  (Signed on 08-Jul-15 16:57)  Authored  Last Updated: 08-Jul-15 16:57 by Scot JunElliott, Verenise Moulin T (MD)

## 2014-07-07 NOTE — Consult Note (Signed)
Pt discussed with Hospitalist Dr. Mordecai MaesSanchez and the radiologist Dr. 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  Pt stable today per Dr. Mordecai MaesSanchez, he speaks Spanish and explained the plan to the patient. They need to wait a few days while sepsis is being treated.  Dr. Servando SnareWohl on call this weekend.  Electronic Signatures: Scot JunElliott, Robert T (MD)  (Signed on 10-Jul-15 17:39)  Authored  Last Updated: 10-Jul-15 17:39 by Scot JunElliott, Robert T (MD)

## 2014-07-07 NOTE — Discharge Summary (Signed)
Dates of Admission and Diagnosis:  Date of Admission 17-Sep-2013   Date of Discharge 01-Jan-0001   Admitting Diagnosis sepsis   Final Diagnosis Sepsis- due to SBP and bacteremia- MSSA-  repeat bl cx negative on 09/19/13 Likley SBP Hepatic cirrhosis- ch hep C Ch thrombocytopenia.    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  She is presenting to the ED with a chief complaint of feeling sick, associated with chills and rigors and myalgias. The patient is also complaining of generalized abdominal pain, mostly located in the epigastric region, associated with clear liquid vomiting. She denies any hematemesis or melena. Denies any diarrhea. No sick people around.  Denies any similar complaints in the past. The patient had a paracentesis done in the past but she could not recall when it was done. The patient is brought into the ED, her ammonia levels are elevated. Lactulose was given and there was a concern about spontaneous bacterial peritonitis as the patient has liver cirrhosis along with some myalgias and elevated lactic acid, complaining of epigastric abdominal pain associated with vomiting.   Blood cultures were obtained and IV antibiotics were given.  The hospitalist team was called to admit the patient. With the help of a Spanish interpreter, Ms. Annette Howard, history was obtained from the patient. Her son is at the bedside. The patient is not confused and answering all questions accurately. One dose of lactulose was given in the ED.   Allergies:  No Known Allergies:   Hepatic:  05-Jul-15 04:37   Bilirubin, Total  3.6  Alkaline Phosphatase  393 (45-117 NOTE: New Reference Range 02/03/13)  SGPT (ALT) 72  SGOT (AST)  92  Total Protein, Serum  6.1  Albumin, Serum  2.3  06-Jul-15 03:30   Bilirubin, Total  4.7  10-Jul-15 04:19    Bilirubin, Total  3.0  12-Jul-15 03:50   Bilirubin, Total  2.5  Alkaline Phosphatase  151 (45-117 NOTE: New Reference Range 02/03/13)  SGPT (ALT) 60  SGOT (AST)  102  Total Protein, Serum  4.3  Albumin, Serum  1.7  LabUnknown:  05-Jul-15 04:37   Ind. Clindamycin Resistance NEGATIVE  Routine Micro:  05-Jul-15 04:37   Micro Text Report BLOOD CULTURE   ORGANISM 1                STAPHYLOCOCCUS AUREUS   COMMENT                   IN ANAEROBE BOTTLE ONLY IN AEROBIC BOTTLE ONLY   COMMENT                   -   GRAM STAIN                GRAM POSITIVE COCCI IN CLUSTERS   ANTIBIOTIC                ORG#1     CIPROFLOXACIN                 S         CLINDAMYCIN                   S         ERYTHROMYCIN                  S  GENTAMICIN                    S         LEVOFLOXACIN                  S         LINEZOLID           S         OXACILLIN                     S         TIGECYCLINE                   S         CEFOXITIN SCREEN              NEGATIVE  INDUCIBLE CLINDAMYCIN RESISTANNEGATIVE  Micro Text Report BLOOD CULTURE   ORGANISM 1                STAPHYLOCOCCUS AUREUS   COMMENT                   IN AEROBIC AND ANAEROBIC BOTTLES   GRAM STAIN                GRAM POSITIVE COCCI IN CLUSTERS   ANTIBIOTIC                    ORG#1     CIPROFLOXACIN            S         CLINDAMYCIN                   S         ERYTHROMYCIN                  S         GENTAMICIN                    S         LEVOFLOXACIN                  S         LINEZOLID                     S         OXACILLIN       S         TIGECYCLINE                   S         CEFOXITIN SCREEN              NEGATIVE  INDUCIBLE CLINDAMYCIN RESISTANNEGATIVE  Culture Comment IN ANAEROBE BOTTLE ONLY IN AEROBIC BOTTLE ONLY  Culture Comment IN AEROBIC AND ANAEROBIC BOTTLES  Organism Name STAPHYLOCOCCUS AUREUS  Organism Name STAPHYLOCOCCUS AUREUS  Clindamycin Sensitivity S  Clindamycin Sensitivity S  Oxacillin Sensitivity S   Oxacillin Sensitivity S  Ciprofloxacin Sensitivity S  Ciprofloxacin Sensitivity S  Gentamicin Sensitivity S  Gentamicin Sensitivity S  Erythromycin Sensitivity S  Erythromycin Sensitivity S  Levofloxacin Sensitivity S  Levofloxacin Sensitivity S  Linezolid Sensitivity S  Linezolid Sensitivity S  Tigecycline Sensitivity S  Tigecycline Sensitivity S  Cefoxitin Scrn. NEGATIVE  Cefoxitin Scrn. NEGATIVE  Organism 1 STAPHYLOCOCCUS AUREUS  Organism 1 STAPHYLOCOCCUS AUREUS  Culture Comment . -  Culture Comment . ID TO FOLLOW  Gram Stain 1 GRAM POSITIVE COCCI IN CLUSTERS  Gram Stain 1 GRAM POSITIVE COCCI IN CLUSTERS  07-Jul-15 16:08   Micro Text Report BLOOD CULTURE   COMMENT                   NO GROWTH AEROBICALLY/ANAEROBICALLY IN 5 DAYS   ANTIBIOTIC                       Culture Comment NO GROWTH AEROBICALLY/ANAEROBICALLY IN 5 DAYS  Result(s) reported on 24 Sep 2013 at 04:00PM.    17:35   Micro Text Report BLOOD CULTURE   COMMENT                   NO GROWTH AEROBICALLY/ANAEROBICALLY IN 5 DAYS   ANTIBIOTIC                       Culture Comment NO GROWTH AEROBICALLY/ANAEROBICALLY IN 5 DAYS  Result(s) reported on 24 Sep 2013 at 05:00PM.  08-Jul-15 10:08   Micro Text Report HIV 1/2 AG AB COMBO   HIV 1/2 ANTIBODIES        NON-REACTIVE ANTIBODY   HIV-1 p24 ANTIGEN         NON-REACTIVE ANTIGEN   INTERPRETATION            NONREACTIVE.  A NONREACTIVE test result means that HIV-1 or HIV-2 antibodies and HIV-1 p24 antigen were not detected in the specimen.   ANTIBIOTIC                       General Ref:  05-Jul-15 04:37   Acetaminophen, Serum < 2 (10-30 POTENTIALLY TOXIC:  > 200 mcg/mL  > 50 mcg/mL at 12 hr after  ingestion  > 300 mcg/mL at 4 hr after  ingestion)  Salicylates, Serum < 1.7 (0.0-2.8 Therapeutic 2.8-20.0 mg/dL Toxic >30.0 mg/dL)  Cardiology:  08-Jul-15 08:02   Echo Doppler REASON FOR EXAM:     COMMENTS:     PROCEDURE: [facility] - ECHO DOPPLER COMPLETE(TRANSTHOR)   - Sep 20 2013  8:02AM   RESULT: Echocardiogram Report  Patient Name:   Annette Howard Date of Exam: 09/20/2013 Medical Rec #:  [id number]               Custom1: Date of Birth:  Dec 13, 1955            Height:       59.8 in Patient Age:    47 years             Weight:       180.8 lb Patient Gender: F                    BSA:          1.78 m??  Indications: Endocarditis Sonographer:    Sherrie Sport RDCS Referring Phys: Nicholes Mango  Summary:  1. Left ventricular ejection fraction, by visual estimation, is 65 to  70%.  2. Impaired relaxation pattern of LV diastolic filling.  3. Decreased left ventricular internal cavity size. 2D AND M-MODE MEASUREMENTS (normal ranges within parentheses): Left Ventricle:          Normal IVSd (2D):      1.09 cm (0.7-1.1) LVPWd (2D):     0.98 cm (0.7-1.1) Aorta/LA:  Normal LVIDd (2D):     3.43 cm (3.4-5.7) Aortic Root (2D): 2.30 cm (2.4-3.7) LVIDs (2D):     2.33 cm           Left Atrium (2D): 3.40 cm (1.9-4.0) LV FS (2D):     32.1 %   (>25%) LV EF (2D):     61.4 %   (>50%)                                   Right Ventricle:                                   RVd (2D):        0.03 cm LV DIASTOLIC FUNCTION: MV Peak E: 0.81 m/s E/e' Ratio: 11.10 MV Peak A: 1.08 m/s Decel Time: 197 msec E/A Ratio: 0.75 SPECTRAL DOPPLER ANALYSIS (where applicable): Mitral Valve: MV P1/2 Time: 57.13 msec MV Area, PHT: 3.85 cm?? Aortic Valve: AoV Max Vel: 1.47 m/s AoV Peak PG: 8.6 mmHg AoV Mean PG: LVOT Vmax: 1.22 m/s LVOT VTI:  LVOT Diameter: 2.00 cm AoV Area, Vmax: 2.61 cm?? AoV Area, VTI:  AoV Area, Vmn: Tricuspid Valve and PA/RV Systolic Pressure: TR Max Velocity: 1.94 m/s RA  Pressure:5 mmHg RVSP/PASP: 20.1 mmHg Pulmonic Valve: PV Max Velocity: 1.70 m/s PV Max PG: 11.6 mmHg PV Mean PG:  PHYSICIAN INTERPRETATION: Left Ventricle: The left ventricular internal cavity size was decreased.  LV septal wall thickness was normal. LV posterior wall thickness was   normal. No left ventricular hypertrophy. Left ventricular ejection  fraction, by visual estimation, is 65 to 70%. Spectral Doppler shows  impaired relaxation pattern of LV diastolic filling. Right Ventricle: The rightventricular size is normal. Global RV systolic  function is normal. Left Atrium: The left atrium is normal in size. Right Atrium: The right atrium is normal in size. Mitral Valve: The mitral valve is normal in structure. No evidence of  mitral valve regurgitation is seen. Tricuspid Valve: The tricuspid regurgitant velocity is 1.94 m/s, and with  an assumed right atrial pressure of 5 mmHg, the estimated right  ventricular systolic pressure is normal at 20.1 mmHg. Aortic Valve: The aortic valve is normal. The aortic valve is  structurally normal, with no evidence of sclerosis or stenosis. No  evidence of aortic valve regurgitation is seen. Aorta: The aortic root is normal in size and structure. Venous: The inferior vena cava was normal.  [id number] Mertie Moores Electronically signed by [phone] Mertie Moores Signature Date/Time: 09/20/2013/12:37:12 PM  *** Final *** IMPRESSION: .    Verified By: Wonda Cheng. Acie Fredrickson, M.D., MD  Routine Chem:  05-Jul-15 04:37   Result Comment ANAEROBIC BOTTLE - NOTIFIED OF CRITICAL VALUE AEROBIC BOTTLE - NOTIFIED OF CRITICAL VALUE  - CALLED TO BRANDY GRAHAM AT 1916 09/17/13  - BY SAL.  - READ-BACK PROCESS PERFORMED.  Result(s) reported on 21 Sep 2013 at 07:48AM.  Glucose, Serum  133  BUN 14  Creatinine (comp) 0.69  Sodium, Serum  133  Potassium, Serum 4.6  Chloride, Serum 104  CO2, Serum 21  Calcium (Total), Serum  7.7  Osmolality (calc) 269  eGFR (African American) >60  eGFR (Non-African American) >60 (eGFR values <55mL/min/1.73 m2 may be an indication of chronic kidney disease (CKD). Calculated eGFR is useful in patients with stable renal function. The eGFR calculation will not be reliable  in acutely ill patients when serum creatinine  is changing rapidly. It is not useful in  patients on dialysis. The eGFR calculation may not be applicable to patients at the low and high extremes of body sizes, pregnant women, and vegetarians.)  Anion Gap 8  Ammonia, Plasma  70 (Result(s) reported on 17 Sep 2013 at 05:26AM.)  Ethanol, S. < 3  Ethanol % (comp) < 0.003 (Result(s) reported on 17 Sep 2013 at 05:24AM.)  Lipase 173 (Result(s) reported on 17 Sep 2013 at 05:20AM.)  06-Jul-15 03:30   Creatinine (comp) 0.78  Calcium (Total), Serum  7.5  08-Jul-15 10:08   Creatinine (comp) 0.81  Calcium (Total), Serum  7.5  10-Jul-15 04:19   Creatinine (comp) 0.73  Calcium (Total), Serum  6.6  12-Jul-15 03:50   Result Comment LABS - This specimen was collected through an   - indwelling catheter or arterial line.  - A minimum of 80mls of blood was wasted prior    - to collecting the sample.  Interpret  - results with caution.  Result(s) reported on 24 Sep 2013 at 04:42AM.  Glucose, Serum 82  BUN 13  Creatinine (comp) 0.82  Sodium, Serum 142  Potassium, Serum 3.8  Chloride, Serum  111  CO2, Serum 25  Calcium (Total), Serum  7.0  Osmolality (calc) 282  eGFR (African American) >60  eGFR (Non-African American) >60 (eGFR values <62mL/min/1.73 m2 may be an indication of chronic kidney disease (CKD). Calculated eGFR is useful in patients with stable renal function. The eGFR calculation will not be reliable in acutely ill patients when serum creatinine is changing rapidly. It is not useful in  patients on dialysis. The eGFR calculation may not be applicable to patients at the low and high extremes of body sizes, pregnant women, and vegetarians.)  Anion Gap  6  Routine Sero:  08-Jul-15 10:08   - HIV 1/2 Antibodies NON-REACTIVE ANTIBODY  - HIV-1 p24 Antigen NON-REACTIVE ANTIGEN  - Interpretation NONREACTIVE.  A NONREACTIVE test result means that HIV-1 or HIV-2 antibodies and HIV-1 p24 antigen were not detected in the specimen.  Result(s)  reported on 20 Sep 2013 at 04:34PM.  Routine Hem:  05-Jul-15 04:37   WBC (CBC) 10.8  RBC (CBC)  3.39  Hemoglobin (CBC) 12.8  Hematocrit (CBC) 37.6  Platelet Count (CBC)  65 (Result(s) reported on 17 Sep 2013 at 05:15AM.)  MCV  111  MCH  37.7  MCHC 34.0  RDW  15.5  06-Jul-15 03:30   Hemoglobin (CBC)  10.8  Platelet Count (CBC)  43  Lymphocyte % 10.3  08-Jul-15 10:08   Hemoglobin (CBC)  9.3  Platelet Count (CBC)  85    15:09   Hemoglobin (CBC)  9.4 (Result(s) reported on 20 Sep 2013 at 03:56PM.)    22:19   Hemoglobin (CBC)  8.4  Hemoglobin (CBC) -  Platelet Count (CBC)  88  09-Jul-15 12:24   Hemoglobin (CBC)  7.4    21:14   Hemoglobin (CBC)  6.9 (Result(s) reported on 21 Sep 2013 at 09:42PM.)  10-Jul-15 04:19   Hemoglobin (CBC)  7.9  Platelet Count (CBC)  43    12:34   Hemoglobin (CBC)  9.1 (Result(s) reported on 22 Sep 2013 at 01:00PM.)  12-Jul-15 03:50   WBC (CBC) 4.6  RBC (CBC)  2.42  Hemoglobin (CBC)  9.0  Hematocrit (CBC)  26.2  Platelet Count (CBC)  67  MCV  108  MCH  37.4  MCHC 34.5  RDW  18.7  Neutrophil %  46.6  Lymphocyte % 29.9  Monocyte % 18.4  Eosinophil % 3.9  Basophil % 1.2  Neutrophil # 2.1  Lymphocyte # 1.4  Monocyte # 0.8  Eosinophil # 0.2  Basophil # 0.1  13-Jul-15 02:30   Hemoglobin (CBC)  9.2  Platelet Count (CBC)  72   PERTINENT RADIOLOGY STUDIES: XRay:    12-Jul-15 00:04, Chest Portable Single View  Chest Portable Single View   REASON FOR EXAM:    shortness of breath  COMMENTS:       PROCEDURE: DXR - DXR PORTABLE CHEST SINGLE VIEW  - Sep 24 2013 12:04AM     CLINICAL DATA:  Shortness of breath.  Wheezing.  No fever.    EXAM:  PORTABLE CHEST - 1 VIEW    COMPARISON:  09/21/2013    FINDINGS:  Unchanged appearance of right PICC line. Cardiac enlargement without  vascular congestion. Large right and small left pleural effusions  with basilar atelectasis or consolidation. There appears to be some  progression since previous  study. No pneumothorax.     IMPRESSION:  Increasing bilateral pleural effusions and basilar atelectasis/  infiltration, greater on the right.      Electronically Signed    By: Lucienne Capers M.D.    On: 09/24/2013 00:50         Verified By: Neale Burly, M.D.,  [place]:  PACS Image   Nuclear Med:    08-Jul-15 14:44, GI Blood Loss Study - Nuc Med  GI Blood Loss Study - Nuc Med   REASON FOR EXAM:    gi bleed/  COMMENTS:       PROCEDURE: NM  - NM GI BLOOD LOSS STUDY  - Sep 20 2013  2:44PM     CLINICAL DATA:  GI bleed/    EXAM:  NUCLEAR MEDICINE GASTROINTESTINAL BLEEDING SCAN    TECHNIQUE:  Sequential abdominal images were obtained following intravenous  administration of Tc-25m labeled red blood cells.    RADIOPHARMACEUTICALS:  23.1 MCi Tc-65m in-vitro labeled red cells.  COMPARISON:  None.    FINDINGS:  Expected normal areas of radiotracer bowel distribution.    No evidence of abnormal mobile radiotracer accumulation concerning  for active GI hemorrhage.     IMPRESSION:  No scintigraphically appreciable areas of GI hemorrhage.      Electronically Signed    By: Margaree Mackintosh M.D.    On: 09/20/2013 14:56     Verified By: Mikki Santee, M.D., MD   Pertinent Past History:  Pertinent Past History Chronic cirrhosis and anemia. Chronic neck pain. Gastric ulcers. Chronic anemia.   Hospital Course:  Hospital Course 59 year old Hispanic female who came into the ED with a chief complaint of chills and rigors, nausea and vomiting associated with muscle aches and feeling sick.  She will be admitted with the following assessment and plan:   * Sepsis  with elevated lactic acid, tachycardia - possibly spontaneous bacterial peritonitis.  No abdominal pain, N/V.  Blood Cx growing Staph. aureus. and not usually the cause of SBP.  - was on Vanco, on Ceftriaxone for now as it's MSSA ( 09/17/13).  repeat cx negative. - due to  Staph bacteremia. need ABX for 2 weeks  parenteral- from negative bl cx( 09/19/13) PICC placed , BCx are neg ( 09/19/13). -Echo to eval for endocarditis. normal   *Acute GI bleed: s/p EGD. +Peptic ulcer and multiple large gastric fundus varices. + fresh blood.  -continued protonix drip fro 3 days - now swiched to  oral and octreotide.  -Acute blood loss anemia , s/p transfusion HB 9 since 09/22/13- stable. - upgrae diet slowly, stopped Octreotide drip after 4 days.  *gastric ulcers :  candidate for BRTO ([place]-occluded Retrograde Transvenous Obliteration) Dr, Henn(radiologist) and Dr. Laurin Coder has spoken to the pt and family. She prefers to go to [facility]- I spoke to GI - Dr. Tiffany Kocher- as per him- she has large and bad verices- can bleed any time.   So need to get transferred to [facility]. Her hepatologist at [facility]- Dr. Claybon Jabs- I called transfer center, will transfer today - depends on bed availability.  * History of gastric ulcer.   *Chronic history of cirrhosis - due to Hep C.  - cont. lactulose for encephalopathy. no findings of encephalopathy.  *. anemia and thrombocytopenia.   - stable and due to chronic liver disease and will monitor.   *. MSSA Bacteremia - source unclear presently.  likley SBP. ??   echo no source. - cont. Ceftriaxone 2 weeks from 09/19/13 Seen pt with help of Spanish translator. transfer to [facility].   Condition on Discharge Stable   Code Status:  Code Status Full Code   DISCHARGE INSTRUCTIONS HOME MEDS:  Medication Reconciliation: Patient's Home Medications at Discharge:     Medication Instructions  spironolactone 50 mg oral tablet  1 tab(s) orally once a day   oxycodone 5 mg oral tablet  1 tab(s) orally every 4-6 hours, As Needed for severe pain   furosemide 20 mg oral tablet  2 tab(s) orally once a day (at bedtime)   nadolol 20 mg oral tablet  1 tab(s) orally once a day   albuterol-ipratropium 2.5 mg-0.5 mg/3 ml inhalation solution  3 milliliter(s) inhaled 4 times a day   ceftriaxone  1 gram(s)  intravenous every 24 hours   lactulose 10 g/15 ml oral syrup  30 milliliter(s) orally 2 times a day   pantoprazole 40 mg oral delayed release tablet  1 tab(s) orally 2 times a day   ensure clear *  200 milliliter(s) orally 3 times a day (with meals)    PRESCRIPTIONS: ELECTRONICALLY SUBMITTED   Physician's Instructions:  Diet Low Sodium   Dietary Supplements Ensure   Dietary Supplements Frequency Two times per day   Activity Limitations As tolerated   Return to Work Not Applicable   Time frame for Follow Up Appointment 1-2 days  GI at [facility]   Other Comments being transferred to [facility] - for procedure for Gastric verices.     Gaylyn Cheers T(Consultant): [facility], [address] [place], [place] [address], [place]   Neoma Laming Ali(Family Physician): [facility], [address], [place], [place] [address], [place] [phone]  Electronic Signatures: Vaughan Basta (MD)  (Signed 13-Jul-15 17:23)  Authored: ADMISSION DATE AND DIAGNOSIS, CHIEF COMPLAINT/HPI, Allergies, PERTINENT LABS, PERTINENT RADIOLOGY STUDIES, PERTINENT PAST HISTORY, HOSPITAL COURSE, [place], PATIENT INSTRUCTIONS, Follow Up Physician   Last Updated: 13-Jul-15 17:23 by Vaughan Basta (MD)

## 2014-07-07 NOTE — Consult Note (Signed)
VSS afebrile, hgb 9.6, pt [facility].  No new recommendations.  Electronic Signatures: Scot JunElliott, Andrik Sandt T (MD)  (Signed on 13-Jul-15 17:06)  Authored  Last Updated: 13-Jul-15 17:06 by Scot JunElliott, Admiral Marcucci T (MD)

## 2014-07-07 NOTE — Consult Note (Signed)
Since her staph came out of blood cultures and not out of ascitic fluid we cannot say she has SBP as the cause for her staph sepsis.  She needs an echo of her heart valves and an ID consult.  The presense of cirrhosis and ascites does not rule out other sources of staph.   Electronic Signatures: Scot JunElliott, Robert T (MD)  (Signed on 07-Jul-15 17:08)  Authored  Last Updated: 07-Jul-15 17:08 by Scot JunElliott, Robert T (MD)

## 2014-07-07 NOTE — Consult Note (Signed)
PATIENT NAME:  Annette Howard, Annette Howard MR#:  [id number] DATE OF BIRTH:  [birth year]  DATE OF CONSULTATION:  09/20/2013  REQUESTING PHYSICIAN:  Dr. Cherlynn Kaiser CONSULTING PHYSICIAN:  Stann Mainland. Sampson Goon, MD  REASON FOR CONSULTATION:  Staphylococcus aureus bacteremia.   HISTORY OF PRESENT ILLNESS: This is a very pleasant 59 year old female with history of cirrhosis from hepatitis C, chronic anemia, gastric ulcers, and chronic neck pain who was admitted with sudden onset of fevers, chills, body aches. She was also having generalized abdominal pain and vomiting. Upon admission, she had blood cultures drawn and these are growing methicillin-sensitive Staphylococcus aureus.  We are consulted for further assessment and management. Of note, she has also had bright red blood per rectum develop since admission and has undergone work-up for GI bleed.   The patient says she is feeling much better except for the GI bleeding since admission. She denies any prodromal symptoms and says this began quite suddenly. She denies any skin lesions abscesses or boils. She does have vitiligo but has not had any active issues recently.   PAST MEDICAL HISTORY:  1. Cirrhosis due to hepatitis C.  2. Anemia.  3. Neck pain.  4. Gastric ulcers.   PAST SURGICAL HISTORY: Hysterectomy and eye surgery.   ALLERGIES: No known drug allergies.   SOCIAL HISTORY: Originally from [place], lives with her son. No tobacco, alcohol or drugs.   FAMILY HISTORY: Noncontributory.   REVIEW OF SYSTEMS:  Eleven systems reviewed and negative except as per HPI. Antibiotics since admission included initially vancomycin and cefotaxime. Currently, she is on ceftriaxone. Other medications are reviewed per her MAR.   PHYSICAL EXAMINATION:  VITAL SIGNS: Temperature 98.6, pulse 89, blood pressure 118/65, respirations 18, saturation 97% on room air.  GENERAL: She is pleasant, interactive. She is cirrhotic -appearing.  HEENT: On eye exam, her right pupil is  irregular. Left is reactive. She has icterus and jaundice. Her oropharynx is clear with no petechia or lesions or thrush.  NECK: Supple. No anterior cervical, posterior cervical, supraclavicular lymphadenopathy.  HEART: Regular.  LUNGS: Clear to auscultation bilaterally.  ABDOMEN: Somewhat distended but nontender.   EXTREMITIES: Have no clubbing, cyanosis or edema.  SKIN: She has diffuse vitiligo, but no other changes or skin lesions. She has no peripheral stigmata of endocarditis.  NEUROLOGIC: She is alert and oriented x 3. Grossly nonfocal neurologic exam.   DIAGNOSTIC DATA: BUN 23, creatinine 0.81. White count, currently 7.8, hemoglobin 9.3, platelets 113,000. White blood count on admission was 10.8. Blood cultures, 2/2 on July 5th,  are growing Staphylococcus aureus, sensitive to clindamycin, oxacillin, pan sensitive. Followup blood cultures July 7th are no growth to date. Urinalysis July 5th had zero white cells. Echocardiogram showed no evidence of vegetations. Chest x-ray showed a small pleural effusion, but otherwise negative.   IMPRESSION: A 59 year old, history of cirrhosis from hepatitis C, admitted with sudden onset of fever and chills and myalgias. She was found to have Staphylococcus aureus bacteremia of unclear source. There was initial thought this was due to spontaneous bacterial peritonitis, but it is most likely a skin source of infection. No evidence of endocarditis.   With the sudden onset of symptoms and the rapid clearing of her blood cultures and defervescence,  it is likely she could be treated with a 2-week course of IV antibiotics. Ceftriaxone will be the easiest medication to administer at home, although it can cause some biliary sludging.  If GI feels it might be contraindicated, we could use Ancef, although that would be  more frequent dosing. I would recommend at least a 2-week course.  You could place a PICC line tomorrow once her cultures are negative for 48 hours. I can  see in clinic in followup and she will need weekly CBC and CMET to follow her safety labs.   I will check an HIV test. I have discussed this with the patient. This patient was seen with the help of a translator. Thank you for the consult. I will be glad to follow with you.      ____________________________ Stann Mainlandavid P. Sampson GoonFitzgerald, MD dpf:dd D: 09/20/2013 15:55:42 ET T: 09/20/2013 18:13:49 ET JOB#: [id number]  cc: Stann Mainlandavid P. Sampson GoonFitzgerald, MD, <Dictator> Deniece Rankin Sampson GoonFITZGERALD MD ELECTRONICALLY SIGNED 10/02/2013 21:19

## 2014-07-07 NOTE — Consult Note (Signed)
Chief Complaint:  Subjective/Chief Complaint Patient sleeping peacefully this am. No reports of any further GI bleeding. Patient labs have been stable.   VITAL SIGNS/ANCILLARY NOTES: **Vital Signs.:   12-Jul-15 04:59  Vital Signs Type Q 4hr  Temperature Temperature (F) 98.1  Celsius 36.7  Temperature Source oral  Pulse Pulse 69  Respirations Respirations 18  Systolic BP Systolic BP 974  Diastolic BP (mmHg) Diastolic BP (mmHg) 55  Mean BP 72  Pulse Ox % Pulse Ox % 91  Pulse Ox Activity Level  At rest  Oxygen Delivery Room Air/ 21 %   Brief Assessment:  GEN well developed, well nourished, no acute distress   Respiratory normal resp effort   Lab Results: Hepatic:  12-Jul-15 03:50   Bilirubin, Total  2.5  Alkaline Phosphatase  151 (45-117 NOTE: New Reference Range 02/03/13)  SGPT (ALT) 60  SGOT (AST)  102  Total Protein, Serum  4.3  Albumin, Serum  1.7  Routine Chem:  12-Jul-15 03:50   Result Comment LABS - This specimen was collected through an   - indwelling catheter or arterial line.  - A minimum of 66mls of blood was wasted prior    - to collecting the sample.  Interpret  - results with caution.  Result(s) reported on 24 Sep 2013 at 04:42AM.  Result Comment CALCIUM - RESULTS VERIFIED BY REPEAT TESTING.  - C/DEBRA GLADDING/0435/09-24-13/RWW  - NOTIFIED OF CRITICAL VALUE  - READ-BACK PROCESS PERFORMED.  Result(s) reported on 24 Sep 2013 at 04:42AM.  Glucose, Serum 82  BUN 13  Creatinine (comp) 0.82  Sodium, Serum 142  Potassium, Serum 3.8  Chloride, Serum  111  CO2, Serum 25  Calcium (Total), Serum  7.0  Osmolality (calc) 282  eGFR (African American) >60  eGFR (Non-African American) >60 (eGFR values <61mL/min/1.73 m2 may be an indication of chronic kidney disease (CKD). Calculated eGFR is useful in patients with stable renal function. The eGFR calculation will not be reliable in acutely ill patients when serum creatinine is changing rapidly. It is not  useful in  patients on dialysis. The eGFR calculation may not be applicable to patients at the low and high extremes of body sizes, pregnant women, and vegetarians.)  Anion Gap  6  Routine Hem:  12-Jul-15 03:50   WBC (CBC) 4.6  RBC (CBC)  2.42  Hemoglobin (CBC)  9.0  Hematocrit (CBC)  26.2  Platelet Count (CBC)  67  MCV  108  MCH  37.4  MCHC 34.5  RDW  18.7  Neutrophil % 46.6  Lymphocyte % 29.9  Monocyte % 18.4  Eosinophil % 3.9  Basophil % 1.2  Neutrophil # 2.1  Lymphocyte # 1.4  Monocyte # 0.8  Eosinophil # 0.2  Basophil # 0.1   Assessment/Plan:  Assessment/Plan:  Assessment HCV cirrhosis with variceal bleeding.   Plan Patient stable without any further bleeding.  Continue current care. Dr. Vira Agar to resume care tomorrow.   Electronic Signatures: Lucilla Lame (MD)  (Signed 12-Jul-15 10:32)  Authored: Chief Complaint, VITAL SIGNS/ANCILLARY NOTES, Brief Assessment, Lab Results, Assessment/Plan   Last Updated: 12-Jul-15 10:32 by Lucilla Lame (MD)

## 2014-07-07 NOTE — Consult Note (Signed)
Pt feeling significantly better, no abd pain, no chills, fever, vomiting.  blood cult positive for staph.  would treat as staph sepsis unknown cause.  pt diet advanced and she is doing well with it.  Liver tests a little better.  No new suggestions.  Electronic Signatures: Scot JunElliott, Robert T (MD)  (Signed on 06-Jul-15 17:39)  Authored  Last Updated: 06-Jul-15 17:39 by Scot JunElliott, Robert T (MD)

## 2014-07-07 NOTE — Consult Note (Signed)
Brief Consult Note: Diagnosis: Cirrhosis with gastric varices.   Comments: Consulted for possible TIPS procedure.  59 yo Hispanic female with cirrhosis secondary to hepatitis C, chronic anemia and gastric ulcers.  Recently admitted for chills and rigors.  Patient is being treated for bacteremia and recently underwent an endoscopy to evaluate for GI bleeding.  Endoscopy demonstrated gastric varices.  The patient's labs and imaging were reviewed.  Prior MRI of abdomen demonstrates cirrhosis with large gastric varices and prominent gastrorenal shunt.  The main portal vein is small and right port vein is either very small or occluded.    Last bilirubin was 3.0, creat = 0.8 and INR is 1.9.  Here MELD score is 14.    P.E.  General:  No acute distress.  Heart: RRR  Lungs: clear bilaterally  Abd:  soft, probable ascites.  Eyes: scleral icterus.   A:  59 yo with Hepatitis C, cirrhosis, gastric varices and GI bleeding.   Gastric varices management is needed.  Based on the patient's anatomy and labs, she is a better candidate for BRTO ([place]-occluded Retrograde Transvenous Obliteration) than TIPS.  BRTO would be able to treat the gastric varices directly.  The patient is not a great TIPS candidate based on the bilirubin level and portal venous anatomy.  P:  I discussed the BRTO procedure with the patient with a translator.  The patient was upset when I explained that she has chronic liver disease and high risk for variceal bleeding.  Explained to the patient that she would need to be [facility].  I discussed this procedure with Dr. Mechele CollinElliott of GI and he would like to discuss the treatment options with the patient and family.  In addtion, the patient will need updated imaging of the abdomen with a tailored CT exam if we are going to proceed with the BRTO.  Electronic Signatures: Arn MedalHenn, Skylan Lara R (MD)  (Signed 10-Jul-15 13:16)  Authored: Brief Consult Note   Last Updated:  10-Jul-15 13:16 by Arn MedalHenn, Persephanie Laatsch R (MD)

## 2014-07-07 NOTE — Consult Note (Signed)
Patient had gastric varices in fundus and a splotch which likely was bleeding source.  No active bleeding at this time.  No blood in stomach. Will contact radiology to see if someone does TIPS procedure and talk to family about this option.  Start clear liquids.  Hgb 8.4, no bleeding so would hold any transfusions for now.  Check daily hgb.  She is to get a PIC line today I am told.  Also gastric antral ulcer, gastritis and mild portal hypertensive gastropathy.  Electronic Signatures: Scot JunElliott, Maeghan Canny T (MD)  (Signed on 09-Jul-15 08:04)  Authored  Last Updated: 09-Jul-15 08:04 by Scot JunElliott, Deanthony Maull T (MD)

## 2014-07-07 NOTE — Consult Note (Signed)
PATIENT NAME:  Annette PortlandLEON JIMENEZ, Shantera MR#:  [id number] DATE OF BIRTH:  1955-05-04  DATE OF CONSULTATION:  09/17/2013  CONSULTING PHYSICIAN:  Scot Junobert T. Elliott, MD.  REASON FOR CONSULTATION:  The patient is a 59 year old Hispanic female who has a known history of cirrhosis of the liver secondary to hepatitis C. She presented to the ER with the story that she awoke from sleep with chills, myalgias, rigor and had vomiting. She was found to have a mildly elevated lactic acid level and because of the infectious-looking presentation and history of cirrhosis she was treated empirically with 3rd generation cephalosporin Claforan. This was done after blood cultures were obtained. She was interviewed through an interpreter and did not show signs of confusion and answered all questions accurately for the admitting physician.  I was asked to see her in consultation because of the liver disease and clinical diagnosis of spontaneous bacterial peritonitis.   PAST MEDICAL HISTORY: The patient complains of chronic anemia of chronic neck pain.  She a hysterectomy and eye surgery. She had a colonoscopy and an upper endoscopy a few years ago with Dr. Niel HummerIftikhar in these apparently were negative.   ALLERGIES: No known drug allergies.   HABITS: Does not smoke, does not drink, dip tobacco or snuff.   MEDICATIONS: Spironolactone 50 mg a day, oxycodone 5 mg 1 tablet q.4-6 hours, Lasix 20 mg 2 tablets once a day.    REVIEW OF SYSTEMS:  The patient denies dysphagia. No diarrhea. No constipation. No weight change recently. She drinks city water. She does have musculoskeletal aches and pains that she takes oxycodone for.  Since admission to the hospital and the beginning of IV antibiotics she says she feels a lot better. She received a dose of Claforan at 11:00 today.   PHYSICAL EXAMINATION:  VITAL SIGNS: Temperature 98.1, pulse 90, respirations 16, blood pressure 170/72, oxygen saturation 97% on room air.  HEAD: Atraumatic.   CHEST: Clear.  HEART: No murmurs or gallops I can hear.  ABDOMEN: Mildly obese, nontender. No hepatosplenomegaly that I can palpate.   SKIN: Warm and dry. The patient's story taken through translator, children are present who speak [place].   ASSESSMENT: The acute onset of chills, fever, myalgias, arthralgias, and abdominal pain certainly could be spontaneous bacterial peritonitis.  Since she has been started on antibiotics a paracentesis would not be especially helpful at this time. I would go ahead and proceed with a full course of therapies, and she can be switched to pills probably in a couple of days. No further plans at this time, no further recommendations at this time.    ____________________________ Scot Junobert T. Elliott, MD rte:lt D: 09/17/2013 13:39:58 ET T: 09/17/2013 14:58:24 ET JOB#: [id number]  cc: Scot Junobert T. Elliott, MD, <Dictator> Cloud SinkJade J. Dolores FrameSung, MD Scot JunOBERT T ELLIOTT MD ELECTRONICALLY SIGNED 09/26/2013 17:48

## 2014-07-12 ENCOUNTER — Inpatient Hospital Stay
Admission: RE | Admit: 2014-07-12 | Discharge: 2014-07-15 | DRG: 441 | Disposition: A | Discharge status: Home Health | Payer: Medicaid Other | Attending: Internal Medicine | Admitting: Internal Medicine

## 2014-07-12 DIAGNOSIS — K7200 Acute and subacute hepatic failure without coma: Secondary | ICD-10-CM | POA: Diagnosis present

## 2014-07-12 DIAGNOSIS — H5441 Blindness, right eye, normal vision left eye: Secondary | ICD-10-CM | POA: Diagnosis present

## 2014-07-12 DIAGNOSIS — B1920 Unspecified viral hepatitis C without hepatic coma: Secondary | ICD-10-CM | POA: Diagnosis present

## 2014-07-12 DIAGNOSIS — K7682 Hepatic encephalopathy: Secondary | ICD-10-CM

## 2014-07-12 DIAGNOSIS — R188 Other ascites: Secondary | ICD-10-CM | POA: Diagnosis present

## 2014-07-12 DIAGNOSIS — J9600 Acute respiratory failure, unspecified whether with hypoxia or hypercapnia: Secondary | ICD-10-CM | POA: Diagnosis present

## 2014-07-12 DIAGNOSIS — K7290 Hepatic failure, unspecified without coma: Secondary | ICD-10-CM | POA: Diagnosis present

## 2014-07-12 DIAGNOSIS — N390 Urinary tract infection, site not specified: Secondary | ICD-10-CM | POA: Diagnosis present

## 2014-07-12 DIAGNOSIS — D6959 Other secondary thrombocytopenia: Secondary | ICD-10-CM | POA: Diagnosis present

## 2014-07-12 DIAGNOSIS — K7469 Other cirrhosis of liver: Secondary | ICD-10-CM | POA: Diagnosis present

## 2014-07-12 DIAGNOSIS — D638 Anemia in other chronic diseases classified elsewhere: Secondary | ICD-10-CM | POA: Diagnosis present

## 2014-07-12 DIAGNOSIS — N179 Acute kidney failure, unspecified: Secondary | ICD-10-CM | POA: Diagnosis present

## 2014-07-12 DIAGNOSIS — Z8711 Personal history of peptic ulcer disease: Secondary | ICD-10-CM | POA: Diagnosis not present

## 2014-07-12 DIAGNOSIS — Z79899 Other long term (current) drug therapy: Secondary | ICD-10-CM | POA: Diagnosis not present

## 2014-07-12 LAB — URINALYSIS, COMPLETE
Bacteria: NONE SEEN
Bilirubin,UR: NEGATIVE
Blood: NEGATIVE
Glucose,UR: NEGATIVE mg/dL (ref 0–75)
KETONE: NEGATIVE
Nitrite: NEGATIVE
PH: 7 (ref 4.5–8.0)
PROTEIN: NEGATIVE
Specific Gravity: 1.009 (ref 1.003–1.030)

## 2014-07-12 LAB — TSH: Thyroid Stimulating Horm: 2.426 u[IU]/mL

## 2014-07-12 LAB — DRUG SCREEN, URINE
AMPHETAMINES, UR SCREEN: NEGATIVE
BARBITURATES, UR SCREEN: NEGATIVE
Benzodiazepine, Ur Scrn: NEGATIVE
COCAINE METABOLITE, UR ~~LOC~~: NEGATIVE
Cannabinoid 50 Ng, Ur ~~LOC~~: NEGATIVE
MDMA (Ecstasy)Ur Screen: NEGATIVE
METHADONE, UR SCREEN: NEGATIVE
OPIATE, UR SCREEN: NEGATIVE
Phencyclidine (PCP) Ur S: NEGATIVE
Tricyclic, Ur Screen: NEGATIVE

## 2014-07-12 LAB — SALICYLATE LEVEL: Salicylates, Serum: <4 mg/dL

## 2014-07-12 LAB — LACTIC ACID, PLASMA: Lactic Acid, Venous: 2.4 mmol/L

## 2014-07-12 LAB — TROPONIN I: Troponin-I: <0.03 ng/mL

## 2014-07-12 LAB — CBC
HCT: 26.7 % — ABNORMAL LOW (ref 35.0–47.0)
HGB: 8.9 g/dL — ABNORMAL LOW (ref 12.0–16.0)
MCH: 33.4 pg (ref 26.0–34.0)
MCHC: 33.3 g/dL (ref 32.0–36.0)
MCV: 100 fL (ref 80–100)
Platelet: 62 10*3/uL — ABNORMAL LOW (ref 150–440)
RBC: 2.66 10*6/uL — AB (ref 3.80–5.20)
RDW: 20.4 % — AB (ref 11.5–14.5)
WBC: 6.8 10*3/uL (ref 3.6–11.0)

## 2014-07-12 LAB — COMPREHENSIVE METABOLIC PANEL
ALBUMIN: 2.5 g/dL — AB
ALK PHOS: 194 U/L — AB
ALT: 47 U/L
ANION GAP: 6 — AB (ref 7–16)
BILIRUBIN TOTAL: 5.1 mg/dL — AB
BUN: 24 mg/dL — AB
CO2: 21 mmol/L — AB
CREATININE: 1.07 mg/dL — AB
Calcium, Total: 7.6 mg/dL — ABNORMAL LOW
Chloride: 109 mmol/L
EGFR (African American): >60
EGFR (Non-African Amer.): 57 — ABNORMAL LOW
Glucose: 128 mg/dL — ABNORMAL HIGH
POTASSIUM: 5.5 mmol/L — AB
SGOT(AST): 80 U/L — ABNORMAL HIGH
SODIUM: 136 mmol/L
TOTAL PROTEIN: 5.1 g/dL — AB

## 2014-07-12 LAB — ETHANOL: Ethanol: <5 mg/dL

## 2014-07-12 LAB — PROTIME-INR
INR: 1.6
Prothrombin Time: 19.6 secs — ABNORMAL HIGH

## 2014-07-12 LAB — PRO B NATRIURETIC PEPTIDE: B-Type Natriuretic Peptide: 103 pg/mL — ABNORMAL HIGH

## 2014-07-12 LAB — APTT: ACTIVATED PTT: 28.6 s (ref 23.6–35.9)

## 2014-07-12 LAB — CK TOTAL AND CKMB (NOT AT ARMC)
CK, TOTAL: 68 U/L
CK-MB: 1.2 ng/mL

## 2014-07-12 LAB — HEMOGLOBIN: HGB: 10.4 g/dL — AB (ref 12.0–16.0)

## 2014-07-12 LAB — ACETAMINOPHEN LEVEL: Acetaminophen: <10 ug/mL

## 2014-07-12 LAB — MAGNESIUM: Magnesium: 1.8 mg/dL

## 2014-07-12 LAB — AMMONIA: Ammonia, Plasma: 257 umol/L — ABNORMAL HIGH

## 2014-07-13 LAB — CBC WITH DIFFERENTIAL/PLATELET
BASOS ABS: 0.1 10*3/uL (ref 0.0–0.1)
Basophil %: 0.5 %
Eosinophil #: 0.1 10*3/uL (ref 0.0–0.7)
Eosinophil %: 0.7 %
HCT: 25.7 % — AB (ref 35.0–47.0)
HGB: 8.6 g/dL — ABNORMAL LOW (ref 12.0–16.0)
LYMPHS PCT: 8.4 %
Lymphocyte #: 0.9 10*3/uL — ABNORMAL LOW (ref 1.0–3.6)
MCH: 33.9 pg (ref 26.0–34.0)
MCHC: 33.5 g/dL (ref 32.0–36.0)
MCV: 101 fL — ABNORMAL HIGH (ref 80–100)
MONOS PCT: 11.9 %
Monocyte #: 1.3 x10 3/mm — ABNORMAL HIGH (ref 0.2–0.9)
Neutrophil #: 8.3 10*3/uL — ABNORMAL HIGH (ref 1.4–6.5)
Neutrophil %: 78.5 %
PLATELETS: 64 10*3/uL — AB (ref 150–440)
RBC: 2.54 10*6/uL — ABNORMAL LOW (ref 3.80–5.20)
RDW: 21 % — AB (ref 11.5–14.5)
WBC: 10.6 10*3/uL (ref 3.6–11.0)

## 2014-07-13 LAB — AMMONIA
Ammonia, Plasma: 37 umol/L — ABNORMAL HIGH
Ammonia, Plasma: 79 umol/L — ABNORMAL HIGH

## 2014-07-13 LAB — COMPREHENSIVE METABOLIC PANEL
ALBUMIN: 2.6 g/dL — AB
ANION GAP: 10 (ref 7–16)
Alkaline Phosphatase: 173 U/L — ABNORMAL HIGH
BILIRUBIN TOTAL: 9 mg/dL — AB
BUN: 28 mg/dL — ABNORMAL HIGH
CO2: 21 mmol/L — AB
CREATININE: 1.4 mg/dL — AB
Calcium, Total: 8.4 mg/dL — ABNORMAL LOW
Chloride: 108 mmol/L
EGFR (African American): 48 — ABNORMAL LOW
EGFR (Non-African Amer.): 41 — ABNORMAL LOW
Glucose: 136 mg/dL — ABNORMAL HIGH
Potassium: 3.9 mmol/L
SGOT(AST): 68 U/L — ABNORMAL HIGH
SGPT (ALT): 46 U/L
SODIUM: 139 mmol/L
Total Protein: 5.4 g/dL — ABNORMAL LOW

## 2014-07-13 LAB — BILIRUBIN, DIRECT: Bilirubin, Direct: 3.3 mg/dL — ABNORMAL HIGH

## 2014-07-14 DIAGNOSIS — K7682 Hepatic encephalopathy: Secondary | ICD-10-CM | POA: Diagnosis present

## 2014-07-14 DIAGNOSIS — K7290 Hepatic failure, unspecified without coma: Secondary | ICD-10-CM | POA: Diagnosis present

## 2014-07-14 LAB — COMPREHENSIVE METABOLIC PANEL
ALT: 37 U/L
AST: 50 U/L — AB
Albumin: 2.2 g/dL — ABNORMAL LOW
Alkaline Phosphatase: 140 U/L — ABNORMAL HIGH
Anion Gap: 6 — ABNORMAL LOW (ref 7–16)
BUN: 27 mg/dL — ABNORMAL HIGH
Bilirubin,Total: 5.5 mg/dL — ABNORMAL HIGH
Calcium, Total: 8 mg/dL — ABNORMAL LOW
Chloride: 115 mmol/L — ABNORMAL HIGH
Co2: 21 mmol/L — ABNORMAL LOW
Creatinine: 1.11 mg/dL — ABNORMAL HIGH
EGFR (Non-African Amer.): 54 — ABNORMAL LOW
GLUCOSE: 133 mg/dL — AB
Potassium: 3.4 mmol/L — ABNORMAL LOW
SODIUM: 142 mmol/L
Total Protein: 4.7 g/dL — ABNORMAL LOW

## 2014-07-14 LAB — CBC WITH DIFFERENTIAL/PLATELET
BASOS ABS: 0.1 10*3/uL (ref 0.0–0.1)
Basophil %: 0.8 %
EOS ABS: 0.2 10*3/uL (ref 0.0–0.7)
EOS PCT: 3.1 %
HCT: 25 % — ABNORMAL LOW (ref 35.0–47.0)
HGB: 8.4 g/dL — ABNORMAL LOW (ref 12.0–16.0)
Lymphocyte #: 0.8 10*3/uL — ABNORMAL LOW (ref 1.0–3.6)
Lymphocyte %: 13.2 %
MCH: 34.5 pg — AB (ref 26.0–34.0)
MCHC: 33.6 g/dL (ref 32.0–36.0)
MCV: 103 fL — ABNORMAL HIGH (ref 80–100)
MONO ABS: 0.9 x10 3/mm (ref 0.2–0.9)
Monocyte %: 14.8 %
NEUTROS PCT: 68.1 %
Neutrophil #: 4.3 10*3/uL (ref 1.4–6.5)
Platelet: 52 10*3/uL — ABNORMAL LOW (ref 150–440)
RBC: 2.43 10*6/uL — AB (ref 3.80–5.20)
RDW: 21.5 % — AB (ref 11.5–14.5)
WBC: 6.3 10*3/uL (ref 3.6–11.0)

## 2014-07-14 LAB — CULTURE, BLOOD (SINGLE)

## 2014-07-14 LAB — PHOSPHORUS: PHOSPHORUS: 4.4 mg/dL

## 2014-07-14 LAB — MAGNESIUM: MAGNESIUM: 1.5 mg/dL — AB

## 2014-07-14 LAB — AMMONIA: AMMONIA, PLASMA: 61 umol/L — AB

## 2014-07-14 LAB — CLOSTRIDIUM DIFFICILE(ARMC)

## 2014-07-14 LAB — OCCULT BLOOD X 1 CARD TO LAB, STOOL: Occult Blood, Feces: POSITIVE

## 2014-07-14 MED ORDER — PRO-STAT SUGAR FREE PO LIQD: 30.0000 mL | Freq: Every day | ORAL | Status: DC

## 2014-07-14 MED ORDER — CEFTRIAXONE SODIUM IN DEXTROSE 20 MG/ML IV SOLN
1.0000 g | INTRAVENOUS | Status: DC
  Administered 2014-07-15: 1 g via INTRAVENOUS
  Filled 2014-07-14: qty 50

## 2014-07-14 MED ORDER — ONDANSETRON HCL 4 MG/2ML IJ SOLN: 4.0000 mg | INTRAMUSCULAR | Status: DC | PRN

## 2014-07-14 MED ORDER — PANTOPRAZOLE SODIUM 40 MG PO PACK
40.0000 mg | PACK | Freq: Every day | ORAL | Status: DC
  Administered 2014-07-15: 40 mg
  Filled 2014-07-14: qty 20

## 2014-07-14 MED ORDER — GLYCOPYRROLATE 0.2 MG/ML IJ SOLN: 0.4000 mg | INTRAMUSCULAR | Status: DC | PRN

## 2014-07-14 MED ORDER — FENTANYL CITRATE (PF) 100 MCG/2ML IJ SOLN: 50.0000 ug | INTRAMUSCULAR | Status: DC | PRN

## 2014-07-14 MED ORDER — VITAL HIGH PROTEIN PO LIQD: 1000.0000 mL | ORAL | Status: DC

## 2014-07-14 MED ORDER — CHLORHEXIDINE GLUCONATE 0.12 % MT SOLN: 5.0000 mL | Freq: Two times a day (BID) | OROMUCOSAL | Status: DC

## 2014-07-14 MED ORDER — RIFAXIMIN 550 MG PO TABS
550.0000 mg | ORAL_TABLET | Freq: Two times a day (BID) | ORAL | Status: DC
  Administered 2014-07-15: 550 mg
  Filled 2014-07-14: qty 1

## 2014-07-14 MED ORDER — PNEUMOCOCCAL VAC POLYVALENT 25 MCG/0.5ML IJ INJ: 0.5000 mL | INJECTION | INTRAMUSCULAR | Status: DC | PRN

## 2014-07-14 MED ORDER — DOCUSATE SODIUM 100 MG PO CAPS: 100.0000 mg | ORAL_CAPSULE | Freq: Two times a day (BID) | ORAL | Status: DC | PRN

## 2014-07-14 MED ORDER — SENNA 8.6 MG PO TABS: 1.0000 | ORAL_TABLET | Freq: Two times a day (BID) | ORAL | Status: DC | PRN

## 2014-07-14 MED ORDER — SODIUM CHLORIDE 0.9 % IV SOLN
INTRAVENOUS | Status: DC
  Administered 2014-07-15: via INTRAVENOUS

## 2014-07-14 MED ORDER — FENTANYL CITRATE (PF) 100 MCG/2ML IJ SOLN: 25.0000 ug | INTRAMUSCULAR | Status: DC | PRN

## 2014-07-14 MED ORDER — FENTANYL CITRATE (PF) 100 MCG/2ML IJ SOLN
25.0000 ug | INTRAMUSCULAR | Status: DC | PRN
  Administered 2014-07-15: 50 ug via INTRAVENOUS

## 2014-07-14 MED ORDER — PROPRANOLOL HCL 20 MG PO TABS
20.0000 mg | ORAL_TABLET | Freq: Two times a day (BID) | ORAL | Status: DC
  Filled 2014-07-14: qty 1

## 2014-07-15 DIAGNOSIS — R188 Other ascites: Secondary | ICD-10-CM

## 2014-07-15 DIAGNOSIS — K7290 Hepatic failure, unspecified without coma: Secondary | ICD-10-CM

## 2014-07-15 DIAGNOSIS — R197 Diarrhea, unspecified: Secondary | ICD-10-CM

## 2014-07-15 DIAGNOSIS — K7460 Unspecified cirrhosis of liver: Secondary | ICD-10-CM

## 2014-07-15 DIAGNOSIS — J9600 Acute respiratory failure, unspecified whether with hypoxia or hypercapnia: Secondary | ICD-10-CM

## 2014-07-15 LAB — BASIC METABOLIC PANEL
Anion gap: 2 — ABNORMAL LOW (ref 5–15)
BUN: 22 mg/dL — ABNORMAL HIGH (ref 6–20)
CO2: 22 mmol/L (ref 22–32)
Calcium: 7.9 mg/dL — ABNORMAL LOW (ref 8.9–10.3)
Chloride: 116 mmol/L — ABNORMAL HIGH (ref 101–111)
Creatinine, Ser: 0.89 mg/dL (ref 0.44–1.00)
GFR calc Af Amer: >60 mL/min (ref >60)
GFR calc non Af Amer: >60 mL/min (ref >60)
Glucose, Bld: 85 mg/dL (ref 65–99)
Potassium: 4 mmol/L (ref 3.5–5.1)
Sodium: 140 mmol/L (ref 135–145)

## 2014-07-15 LAB — MAGNESIUM: Magnesium: 1.8 mg/dL (ref 1.7–2.4)

## 2014-07-15 LAB — PHOSPHORUS: Phosphorus: 4.1 mg/dL (ref 2.5–4.6)

## 2014-07-15 MED ORDER — RIFAXIMIN 550 MG PO TABS: 550.0000 mg | ORAL_TABLET | Freq: Two times a day (BID) | ORAL | Status: AC

## 2014-07-15 MED ORDER — CHLORHEXIDINE GLUCONATE 0.12 % MT SOLN: 15.0000 mL | Freq: Two times a day (BID) | OROMUCOSAL | Status: DC

## 2014-07-15 MED ORDER — CETYLPYRIDINIUM CHLORIDE 0.05 % MT LIQD: 7.0000 mL | Freq: Four times a day (QID) | OROMUCOSAL | Status: DC

## 2014-07-15 MED ORDER — FENTANYL CITRATE (PF) 100 MCG/2ML IJ SOLN
50.0000 ug | Freq: Once | INTRAMUSCULAR | Status: AC
  Administered 2014-07-15: 50 ug via INTRAVENOUS
  Filled 2014-07-15: qty 2

## 2014-07-15 NOTE — Evaluation (Signed)
Physical Therapy Evaluation Patient Details Name: Annette Howard MRN: [id number] DOB: 1956/01/04 Today's Date: 07/15/2014   History of Present Illness  Patient is a pleasant 59 year old Spanish-speaking female who presented to ER after being found unresponsive with AMS in home environment; admitted with hepatic encephalopathy.  Required intubation for airway protection 4/28-4/30, now on RA (maintaining sats >92%).    Clinical Impression  Upon evaluation, patient presents with generalized weakness and mild unsteadiness with all functional mobility.  Currently requiring min assist with L HHA for all transfers/gait (patient declines RW at this time).  May benefit from trial of RW in subsequent sessions.  Patient very motivated and eager for mobility progression and discharge home.  Anticipate good progress towards PT goals.  Would benefit from skilled PT to promote optimal return to PLOF; recommend transition to home with HHPT services upon discharge.    Follow Up Recommendations Home health PT    Equipment Recommendations    May benefit from RW; will trial in subsequent treatment sessions.   Recommendations for Other Services       Precautions / Restrictions Precautions Precautions: Fall Precaution Comments: Spanish-speaking only Restrictions Weight Bearing Restrictions: No Other Position/Activity Restrictions: FWB      Mobility  Bed Mobility Overal bed mobility: Needs Assistance Bed Mobility: Supine to Sit     Supine to sit: Min assist     General bed mobility comments: elevated HOB and use of bedrails  Transfers Overall transfer level: Needs assistance Equipment used: 1 person hand held assist Transfers: Sit to/from [employer]Stand;Stand Pivot Transfers Sit to Stand: Min assist Stand pivot transfers: Min assist       General transfer comment: all transfers completed with L HHA  Ambulation/Gait Ambulation/Gait assistance: Min assist Ambulation Distance (Feet): 120  Feet Assistive device: 1 person hand held assist       General Gait Details: reciprocal stepping with fair step height/length, slowed cadence and gait speed; no overt buckling or LOB  Stairs            Wheelchair Mobility    Modified Rankin (Stroke Patients Only)       Balance Overall balance assessment: Needs assistance Sitting-balance support: No upper extremity supported Sitting balance-Leahy Scale: Normal     Standing balance support: No upper extremity supported Standing balance-Leahy Scale: Fair                               Pertinent Vitals/Pain Pain Assessment: No/denies pain    Home Living Family/patient expects to be discharged to:: Private residence Living Arrangements: Children Available Help at Discharge: Family Type of Home: House Home Access: Stairs to enter Entrance Stairs-Rails: Right Entrance Stairs-Number of Steps: 3 Home Layout: One level Home Equipment: None Additional Comments: lives with son and daughter-in-law, able to provide 24 hour sup/assist as needed    Prior Function Level of Independence: Independent               Hand Dominance        Extremity/Trunk Assessment   Upper Extremity Assessment: Overall WFL for tasks assessed           Lower Extremity Assessment: Overall WFL for tasks assessed (strength at least 4+/5 throughout)      Cervical / Trunk Assessment: Normal  Communication   Communication: No difficulties;Interpreter utilized (Interpreter: Nancy Marusaphael)  Cognition Arousal/Alertness: Awake/alert Behavior During Therapy: WFL for tasks assessed/performed Overall Cognitive Status: Within Functional Limits for tasks assessed  General Comments General comments (skin integrity, edema, etc.): mottled appearance to UE/LEs due to vitaligo?    Exercises  Toilet transfer, ambulatory without assist device, min assist with L HHA.  Fair stability with turns and sit/stand from  variety of surface heights.  Standing balance for clothing management and hygiene, close sup; min/mod assist to ensure cleanliness after loose BM.  (8 min treatment)      Assessment/Plan    PT Assessment Patient needs continued PT services  PT Diagnosis Abnormality of gait;Generalized weakness   PT Problem List Decreased strength;Decreased activity tolerance;Decreased balance;Decreased mobility;Decreased safety awareness  PT Treatment Interventions Gait training;Functional mobility training;Therapeutic activities;Balance training;Patient/family education   PT Goals (Current goals can be found in the Care Plan section) Acute Rehab PT Goals Patient Stated Goal: patient did not verbalize PT Goal Formulation: With patient Time For Goal Achievement: 07/29/14 Potential to Achieve Goals: Good    Frequency Min 2X/week   Barriers to discharge        Co-evaluation               End of Session Equipment Utilized During Treatment: Gait belt Activity Tolerance: Patient tolerated treatment well Patient left: in chair;with nursing/sitter in room;with chair alarm set Nurse Communication: Mobility status         Time: 6578-4696 PT Time Calculation (min) (ACUTE ONLY): 20 min   Charges:   PT Evaluation $Initial PT Evaluation Tier I: 1 Procedure PT Treatments $Therapeutic Activity: 8-22 mins   PT G Codes:        Laury Axon 08/05/14, 10:59 AM

## 2014-07-15 NOTE — Consult Note (Signed)
Pt seen and examined. Please see C. London's notes. Pt with acute hepatic encephalopathy and resp failure. Pt intubated and sedated. Daughter in law present. [facility] Has therapeutic paracentesis 3-4 x/ month. Had about 6 L of ascitic fluid drained. Unclear if albumin given at the time of paracentesis. Pt felt better afterwards. This AM, patient confused. More lethargic as time went. Thus, EMS called. Ammonia level quite high. Drop in hgb. Normally, takes lactulose at home. We need to find out what precipated her encephalopathy post paracentesis. Common causes incl infection or bleeding. Pt does have UTI. Pt now on Abx. Check stool for blood. U/s with doppler to make sure no bleeding into peritoneum post paracentesis. Abd still distended. Continue to moniter hgb and ammonia level. Continue aggressive lactulose treatment. [facility]. Will follow. thanks.  Electronic Signatures: Lutricia Feilh, Etoile Looman (MD)  (Signed on 28-Apr-16 16:33)  Authored  Last Updated: 28-Apr-16 16:33 by Lutricia Feilh, Ki Luckman (MD)

## 2014-07-15 NOTE — Progress Notes (Addendum)
PULMONARY / CRITICAL CARE MEDICINE   Name: Annette Howard MRN: [id number] DOB: 1955-07-17    ADMISSION DATE:  07/12/2014  SIGNIFICANT EVENTS: 07/14/14 - Extubated   SUBJECTIVE:  59 yo hispanic female with Liver cirrhosis admitted to ICU for acute resp failure, patient with acute mental status changes with elevated ammonia levels, [facility]. Extubated on 07/14/14.  Doing well this morning, had some episodes of confusion yesterday afternoon, which resolved this morning, and eating at the bedside. Lactulose on hold secondary to diarrhea.   VITAL SIGNS: Temp:  [98.1 F (36.7 C)] 98.1 F (36.7 C) (05/01 0757) Pulse Rate:  [63-78] 78 (05/01 1000) Resp:  [13-25] 25 (05/01 1000) BP: (83-101)/(38-70) 93/49 mmHg (05/01 1000) SpO2:  [99 %-100 %] 100 % (05/01 1000) Weight:  [169 lb 1.5 oz (76.7 kg)] 169 lb 1.5 oz (76.7 kg) (05/01 0700) HEMODYNAMICS:   VENTILATOR SETTINGS:   INTAKE / OUTPUT:  Intake/Output Summary (Last 24 hours) at 07/15/14 1046 Last data filed at 07/15/14 0600  Gross per 24 hour  Intake    300 ml  Output    550 ml  Net   -250 ml    PHYSICAL EXAMINATION:  GEN Awake sitting in chair   HEENT pale conjunctivae, icteric sclera,    NECK supple  No masses   CARD regular rate   ABD denies tenderness  soft   GU foley catheter in place   LYMPH negative neck, negative axillae   EXTR negative cyanosis/clubbing, positive edema   SKIN No rashes, No ulcers   NEURO negative Babinski R/L, gcs<8T   PSYCH lethargic   RESP no use of accessory muscles, shallow breath sounds    LABS:  CBC  Recent Labs Lab 07/12/14 0940 07/12/14 1920 07/13/14 0611 07/14/14 0352  WBC 6.8  --  10.6 6.3  HGB 8.9* 10.4* 8.6* 8.4*  HCT 26.7*  --  25.7* 25.0*  PLT 62*  --  64* 52*   Coag's  Recent Labs Lab 07/12/14 1110  APTT 28.6  INR 1.6   BMET  Recent Labs Lab 07/13/14 0611 07/14/14 0352 07/15/14 0459  NA  --   --  140   K  --   --  4.0  CL  --   --  116*  CO2 21* 21* 22  BUN 28* 27* 22*  CREATININE 1.40* 1.11* 0.89  GLUCOSE  --   --  85   Electrolytes  Recent Labs Lab 07/13/14 0611 07/14/14 0352 07/15/14 0459  CALCIUM 8.4* 8.0* 7.9*  MG  --   --  1.8  PHOS  --   --  4.1   Sepsis Markers  Recent Labs Lab 07/12/14 0940  LATICACIDVEN 2.4*   ABG No results for input(s): PHART, PCO2ART, PO2ART in the last 168 hours. Liver Enzymes  Recent Labs Lab 07/12/14 0940 07/13/14 0611 07/14/14 0352  AST 80* 68* 50*  ALT 47 46 37  ALKPHOS 194* 173* 140*  ALBUMIN 2.5* 2.6* 2.2*   Cardiac Enzymes No results for input(s): TROPONINI, PROBNP in the last 168 hours. Glucose No results for input(s): GLUCAP in the last 168 hours.  Imaging No results found.   ASSESSMENT / PLAN: 59 yo hispanic female admitted to ICU for acute and severe encephalopahy from acute liver cirrhosis with acute resp failulre from inability to protect airway, now fully awake   1.respiratory failure -tolerating SBT well and following commands -plan for extubation today  2.Cirrhosis -lactulose as needed  3.UTI/acute renal failure - resolving well - IV abx  4.h/o ascites s/p thoracentesisi at [facility]  5. Hepatic encephalopathy - resolving well, cont with GI recs  6. Diarrhea - hx of c. Diff colitis, currently lactuolse on hold - C. Diff toxgenic organism positive, c. Diff antigen positive, however cdiff toxin A and B negative - possible carrier of c.diff, further recs per GI  Stephanie Acre, MD [facility] Pager [phone] (Please enter 7-digits)

## 2014-07-15 NOTE — Consult Note (Signed)
Chief Complaint:  Subjective/Chief Complaint More alert today. Ammonia level much lower. On spontaneous breathing on vent. SBP low this AM. According to daughters, patient was much better and completely alert after paracentesis 2 days ago. It was not until yesterday AM that family noted significant lethargy.   VITAL SIGNS/ANCILLARY NOTES: **Vital Signs.:   29-Apr-16 08:00  Pulse Pulse 83  Respirations Respirations 13  Systolic BP Systolic BP 93  Diastolic BP (mmHg) Diastolic BP (mmHg) 47  Mean BP 62  Pulse Ox % Pulse Ox % 100  Pulse Ox Activity Level  At rest  Oxygen Delivery Ventilator Assisted   Brief Assessment:  GEN no acute distress   Cardiac Regular   Respiratory clear BS   Gastrointestinal distended with ascites   Lab Results: Hepatic:  29-Apr-16 06:11   Bilirubin, Direct  3.3 (0.1-0.5 NOTE: New Reference Range  05/22/14)  Bilirubin, Total  9.0 (0.3-1.2 NOTE: New Reference Range  05/22/14)  Alkaline Phosphatase  173 (38-126 NOTE: New Reference Range  05/22/14)  SGPT (ALT) 46 (14-54 NOTE: New Reference Range  05/22/14)  SGOT (AST)  68 (15-41 NOTE: New Reference Range  05/22/14)  Total Protein, Serum  5.4 (6.5-8.1 NOTE: New Reference Range  05/22/14)  Albumin, Serum  2.6 (3.5-5.0 NOTE: New reference range  05/22/14)  Routine Chem:  29-Apr-16 06:11   Glucose, Serum  136 (65-99 NOTE: New Reference Range  05/22/14)  BUN  28 (6-20 NOTE: New Reference Range  05/22/14)  Creatinine (comp)  1.40 (0.44-1.00 NOTE: New Reference Range  05/22/14)  Sodium, Serum 139 (135-145 NOTE: New Reference Range  05/22/14)  Potassium, Serum 3.9 (3.5-5.1 NOTE: New Reference Range  05/22/14)  Chloride, Serum 108 (101-111 NOTE: New Reference Range  05/22/14)  CO2, Serum  21 (22-32 NOTE: New Reference Range  05/22/14)  Calcium (Total), Serum  8.4 (8.9-10.3 NOTE: New Reference Range  05/22/14)  eGFR (African American)  48  eGFR (Non-African American)  41 (eGFR  values <87mL/min/1.73 m2 may be an indication of chronic kidney disease (CKD). Calculated eGFR is useful in patients with stable renal function. The eGFR calculation will not be reliable in acutely ill patients when serum creatinine is changing rapidly. It is not useful in patients on dialysis. The eGFR calculation may not be applicable to patients at the low and high extremes of body sizes, pregnant women, and vegetarians.)  Anion Gap 10  Ammonia, Plasma  37 (9-35 NOTE: New Reference Range  05/22/14)  Routine Hem:  29-Apr-16 06:11   WBC (CBC) 10.6  RBC (CBC)  2.54  Hemoglobin (CBC)  8.6  Hematocrit (CBC)  25.7  Platelet Count (CBC)  64  MCV  101  MCH 33.9  MCHC 33.5  RDW  21.0  Neutrophil % 78.5  Lymphocyte % 8.4  Monocyte % 11.9  Eosinophil % 0.7  Basophil % 0.5  Neutrophil #  8.3  Lymphocyte #  0.9  Monocyte #  1.3  Eosinophil # 0.1  Basophil # 0.1 (Result(s) reported on 13 Jul 2014 at [facility].)   Radiology Results: XRay:    29-Apr-16 06:24, Chest Portable Single View  Chest Portable Single View   REASON FOR EXAM:    ventilator, chf  COMMENTS:       PROCEDURE: DXR - DXR PORTABLE CHEST SINGLE VIEW  - Jul 13 2014  6:24AM     CLINICAL DATA:  Follow-up of CHF, ventilated patient.    EXAM:  PORTABLE CHEST - 1 VIEW    COMPARISON:  Chest x-ray of  July 12, 2014    FINDINGS:  The lungs are better inflated today. The cardiac silhouette is  top-normal. The pulmonary vascularity is prominent centrally. There  is no significant interstitial edema. There is no pleural effusion  or pneumothorax. The endotracheal tube tip lies 3.7 cm above the  carina. The esophagogastric tube tip projects below the GE junction.     IMPRESSION:  Improved aeration of both lungs with decreased interstitial  prominence. There is no pneumonia nor pulmonary edema. The support  tubes are in reasonable position.      Electronically Signed    By: David  Martinique M.D.    On: 07/13/2014 07:28        Verified By: DAVID A. Martinique, M.D., MD  [place]:    29-Apr-16 09:58, US Abdomen General Survey  US Abdomen General Survey   REASON FOR EXAM:    ascties, cirrhosis. please evaluate portal vein for   patency  COMMENTS:       PROCEDURE: [place]  - US ABDOMEN GENERAL SURVEY  - Jul 13 2014  9:58AM     CLINICAL DATA:  Ascites, cirrhosis.  Evaluate portal vein patency.    EXAM:  ULTRASOUND ABDOMEN COMPLETE    COMPARISON:  Liver MRI 04/11/2013    FINDINGS:  Gallbladder: Surgically absent  Common bile duct: Diameter: 5 mm    Liver: Marked cirrhosis with small liver, surface lobulation, and  heterogeneous echotexture. No focal mass lesion is seen. There is  antegrade flow in the main portal vein. Antegrade flow in the 2  imaged hepatic veins. Moderate simple appearing ascites.    IVC: No abnormality visualized.    Pancreas: Not visualized due to bowel gas.    Spleen: Portal hypertension with chronic enlargement, 12 cm in  length by 6 cm in thickness.    Right Kidney: Length: 10 cm. Echogenicity within normal limits. No  mass or hydronephrosis visualized.    Left Kidney: Length: 11 cm. Echogenicity within normal limits. No  mass or hydronephrosis visualized.    Abdominal aorta: No aneurysm visualized.    Other findings: None.     IMPRESSION:  1. Patent and antegrade main portal vein.  2. Cirrhosis with moderate ascites.      Electronically Signed    By: Monte Fantasia M.D.    On: 07/13/2014 10:05         Verified By: Gilford Silvius, M.D.,   Assessment/Plan:  Assessment/Plan:  Assessment Hepatic encephalopathy. Better with lactulose. Resp failure.  Moderate ascites despite therapeutic paracentesis at [facility] 2 days ago. Records still pending.   Plan Continue regular lactulose. Hopefully, patient can wean off vent soon. Abx coverage. Dr. Rayann Heman will cover over the weekend. Pt already has f/u appt at [facility] on 5/4. thanks   Electronic Signatures: Verdie Shire (MD)  (Signed  29-Apr-16 12:33)  Authored: Chief Complaint, VITAL SIGNS/ANCILLARY NOTES, Brief Assessment, Lab Results, Radiology Results, Assessment/Plan   Last Updated: 29-Apr-16 12:33 by Verdie Shire (MD)

## 2014-07-15 NOTE — Discharge Instructions (Signed)
Encefalopata heptica (Hepatic Encephalopathy) La encefalopata heptica es un sndrome. Se trata de un conjunto de sntomas que ocurren juntos. Se observa principalmente en pacientes con dao heptico conocido como cirrosis. Ocurre cuando el tejido normal del hgado es reemplazado por tejido Designer, television/film set. Los sntomas del sndrome son:  Government social research officer en la personalidad  Trastornos mentales  Nivel de conciencia deprimido Estos cambios se producen debido a que las toxinas se acumulan en el torrente sanguneo. La acumulacin se produce debido a que el hgado con cicatrices no puede eliminar las toxinas del organismo. La ms importante de estas toxinas es el amonaco. Las toxinas pueden causar una conducta anormal y un estado de confusin. Las toxinas en la sangre pueden interferir en la capacidad de cuidarse a s mismo y cuidar de los dems. Algunas personas se tornan somnolientas y no pueden despertarse fcilmente. En los casos graves, el paciente entra en [place] de coma.  CAUSAS Hay varios factores que pueden ocasionar dao heptico y que pueden producir la acumulacin de toxinas. Ellos son:  Jarold Song causan cirrosis en el hgado  El consumo de alcohol a largo plazo con dao heptico progresivo  Hepatitis B o C, con infeccin y dao heptico  Pacientes que no sufren cirrosis pero han sido sometidos a Chief Technology Officer de derivacin  Insuficiencia renal  Hemorragia estomacal e intestinal  Infecciones  Constipacin  Medicamentos que actan sobre el sistema nervioso central  Tratamiento con diurticos  Excesivo consumo de protenas SNTOMAS Los sntomas de este sndrome se categorizan segn la gravedad.   Etapa 0 Encefalopata heptica mnima. No hay cambios detectables en la personalidad o la conducta. Hay cambios mnimos en la memoria, la concentracin, las funciones mentales y la capacidad fsica.  Etapa 1 Cierta falta de conciencia. Disminucin en el mantenimiento de la atencin.  Problemas para sumar o Biochemist, clinical. Posibles problemas con el sueo o cambios en el patrn de sueo normal. Euforia, depresin o irritabilidad. Confusin leve. Enlentecimiento de la capacidad mental. Pueden detectarse temblores.  Etapa 2 Letargo o apata. Desorientacin. Conducta extraa. Habla arrastrando las palabras. Temblores evidentes. Somnolencia, incapaz de Landscape architect. Cambios en la personalidad y confusin.  Etapa 3. Muy somnoliento pero puede despertarse. [place]az de Education officer, environmental tareas mentales, no tiene registro de [place], confusin [place], amnesia, ataques de furia ocasionales, no se comprende el habla.  Etapa 4 Coma con o sin respuesta al estmulo doloroso. DIAGNSTICO En los casos leves, una exhaustiva historia clnica y un adecuado examen clnico pueden ayudar al mdico a considerar la posibilidad de encefalopata leve como causa de los sntomas. El diagnstico es ms claro en los casos ms graves. Un nivel elevado de amonaco es la anormalidad caracterstica que se encuentra en los anlisis de sangre de los pacientes que sufren este sntoma. Otras pruebas pueden ser de ayuda para descartar otras enfermedades.  TRATAMIENTO  Se utilizan medicamentos para disminuir el nivel de [employer]. Esto generalmente produce una mejora.  Las dietas que contienen protenas vegetales son mejores que las dietas ricas en protenas animales, especialmente protenas derivadas de carnes rojas. El consumo de pollo y pescado bien cocidos, adems de las protenas vegetales deber ser analizado junto con su mdico. Los pacientes desnutridos son alentados a Ecologist suplementos nutricionales lquidos a su dieta.  En algunos casos se utilizan antibiticos para tratar de disminuir el volumen de bacterias que producen amonaco en los intestinos.  En los casos moderados a leves, este sndrome generalmente requiere hospitalizacin y medicamentos inyectados directamente en la vena (  va  intravenosa). INSTRUCCIONES PARA EL CUIDADO DOMICILIARIO El objetivo es evitar las cosas que puedan empeorar el trastorno y Data processing manager la acumulacin de amonaco en la [place].  Consuma una dieta normal y bien balanceada. El mdico podr darle algunas indicaciones.  Converse con su mdico antes de tomar suplementos vitamnicos. Las dosis grandes de vitaminas y [address], especialmente la vitamina A, o el cobre, pueden empeorar el dao heptico.  Una dieta baja en sal, restriccin de agua o diurticos podrn ser necesarios para reducir la retencin de lquidos.  Evite el consumo de alcohol y Investment banker, operational, as como tambin medicamentos de venta libre que contengan acetaminofeno (controle las etiquetas). Slo tome medicamentos de venta libre o prescriptos para Primary school teacher, las [place], o bajar la fiebre segn las indicaciones de su mdico.  Evite los frmacos que son txicos para English as a second language teacher. Revise sus medicamentos (tanto los prescriptos como los de venta libre) con su mdico para estar seguro que los que toma no lo perjudican.  Puede ser necesaria la realizacin de anlisis de [place]. Siga el consejo del profesional que lo asiste con respecto al momento en que deber Journalist, newspaper.  En esta enfermedad usted tiene una funcin muy importante en el mantenimiento en buen estado de su salud. Si no sigue las indicaciones de su mdico y Product/process development scientist instrucciones, puede sufrir una discapacidad permanente y [employer]. SOLICITE ATENCIN MDICA SI:  Presenta fatiga o debilidad en aumento.  Observa que se le [employer], los pies, las piernas, el rostro o el abdomen.  Pierde el apetito.  Siente nuseas o vomita.  Desarrolla ictericia. Se denomina ictericia al trastorno que ocasiona tono amarillento en la piel.  Tiene problemas en la concentracin, confusin y o trastornos del sueo. SOLICITE ATENCIN MDICA DE INMEDIATO SI:  Tiene vmitos de sangre de color rojo brillante o semejante a la  borra del caf.  Usted presenta sangre en la orina. O las heces son negras y de Geophysicist/field seismologist.  Tiene fiebre.  Se le forman moretones o tiene hemorragias con facilidad.  Vuelve a arrastrar las palabras, tiene cambios en su conducta o se siente confuso. ASEGRESE QUE:   Comprende estas instrucciones.  Controlar su enfermedad.  Pedir ayuda de inmediato si no mejora o empeora. Document Released: 06/18/2008 Document Revised: 05/25/2011 [facility] Patient Information 2015 [place], [place]. This information is not intended to replace advice given to you by your health care provider. Make sure you discuss any questions you have with your health care provider.   Follow with Primary MD No primary care provider on file. in 7 days   Get CBC, CMP checked  by Primary MD next visit.    Activity: As tolerated with Full fall precautions use walker/cane & assistance as needed   Disposition Home  With home health   Diet: Heart Healthy low sodium.  For Heart failure/liver cirrhosis patients - Check your Weight same time everyday, if you gain over 2 pounds, or you develop in leg swelling, experience more shortness of breath or chest pain, call your Primary MD immediately. Follow Cardiac Low Salt Diet and 1.5 lit/day fluid restriction.   On your next visit with your primary care physician please Get Medicines reviewed and adjusted.   Please request your Prim.MD to go over all Hospital Tests and Procedure/Radiological results at the follow up, please get all Hospital records sent to your Prim MD by signing hospital release before you go home.   If you experience worsening of your admission symptoms, develop shortness  of breath, life threatening emergency, suicidal or homicidal thoughts you must seek medical attention immediately by calling 911 or calling your MD immediately  if symptoms less severe.  You Must read complete instructions/literature along with all the possible adverse  reactions/side effects for all the Medicines you take and that have been prescribed to you. Take any new Medicines after you have completely understood and accpet all the possible adverse reactions/side effects.   Do not drive, operating heavy machinery, perform activities at heights, swimming or participation in water activities or provide baby sitting services if your were admitted for syncope or siezures until you have seen by Primary MD or a Neurologist and advised to do so again.  Do not drive when taking Pain medications.  Coma (Coma) QU ES EL COMA?  El coma es un estado de inconsciencia profunda. Una persona en estado de coma tiene vida pero no puede responder a lo que la rodea.  QU CAUSA EL COMA?  El [place] de coma puede producirse debido a:   Una progresin o complicacin esperable de una enfermedad subyacente.  Un traumatismo en la cabeza con sangrado o hinchazn, o ambos, del cerebro.  Una lesin cerebral por sangrado o hinchazn o ambos, causada por algo que no es un traumatismo. Puede ocurrir si un vaso sanguneo dbil o un aneurisma se rompen en el cerebro.  Un tumor cerebral, que puede causar un dao que provoque sangrado o hinchazn del cerebro.  Falta de oxgeno en el cerebro. Esto puede ocurrir debido a:  Neumona grave.  Enfisema grave.  Anemia grave.  Un incidente por ahogamiento.  Bajo nivel de azcar en [employer].  Intoxicacin o sobredosis de medicamentos recetados o de H. [employer] Heinz.  Efectos txicos del alcohol.  Efectos txicos por la acumulacin de productos de desecho en la sangre, como resultado de una insuficiencia renal o heptica. CULES SON LAS OPCIONES DE TRATAMIENTO? Una vez que la persona se encuentre fuera del peligro inmediato, la atencin del equipo mdico se Mudlogger todo lo posible en el mantenimiento de su estado fsico. El mantenimiento incluye:  Prevenir la neumona.  Prevenir las escaras.  Proporcionar una  nutricin equilibrada.  Prevenir las contracciones musculares permanentes contracturas).  Prevenir deformaciones seas y articulares. Document Released: 03/02/2005 Document Revised: 11/02/2012 [facility] Patient Information 2015 [place], [place]. This information is not intended to replace advice given to you by your health care provider. Make sure you discuss any questions you have with your health care provider.  Coma (Coma) QU ES EL COMA?  El coma es un estado de inconsciencia profunda. Una persona en estado de coma tiene vida pero no puede responder a lo que la rodea.  QU CAUSA EL COMA?  El [place] de coma puede producirse debido a:   Una progresin o complicacin esperable de una enfermedad subyacente.  Un traumatismo en la cabeza con sangrado o hinchazn, o ambos, del cerebro.  Una lesin cerebral por sangrado o hinchazn o ambos, causada por algo que no es un traumatismo. Puede ocurrir si un vaso sanguneo dbil o un aneurisma se rompen en el cerebro.  Un tumor cerebral, que puede causar un dao que provoque sangrado o hinchazn del cerebro.  Falta de oxgeno en el cerebro. Esto puede ocurrir debido a:  Neumona grave.  Enfisema grave.  Anemia grave.  Un incidente por ahogamiento.  Bajo nivel de azcar en [employer].  Intoxicacin o sobredosis de medicamentos recetados o de [employer]. [employer] Heinz.  Efectos txicos del alcohol.  Efectos  txicos por la acumulacin de productos de desecho en la sangre, como resultado de una insuficiencia renal o heptica. CULES SON LAS OPCIONES DE TRATAMIENTO? Una vez que la persona se encuentre fuera del peligro inmediato, la atencin del equipo mdico se Mudlogger todo lo posible en el mantenimiento de su estado fsico. El mantenimiento incluye:  Prevenir la neumona.  Prevenir las escaras.  Proporcionar una nutricin equilibrada.  Prevenir las contracciones musculares permanentes contracturas).  Prevenir deformaciones seas  y articulares. Document Released: 03/02/2005 Document Revised: 11/02/2012 [facility] Patient Information 2015 [place], [place]. This information is not intended to replace advice given to you by your health care provider. Make sure you discuss any questions you have with your health care provider.  Coma (Coma) QU ES EL COMA?  El coma es un estado de inconsciencia profunda. Una persona en estado de coma tiene vida pero no puede responder a lo que la rodea.  QU CAUSA EL COMA?  El [place] de coma puede producirse debido a:   Una progresin o complicacin esperable de una enfermedad subyacente.  Un traumatismo en la cabeza con sangrado o hinchazn, o ambos, del cerebro.  Una lesin cerebral por sangrado o hinchazn o ambos, causada por algo que no es un traumatismo. Puede ocurrir si un vaso sanguneo dbil o un aneurisma se rompen en el cerebro.  Un tumor cerebral, que puede causar un dao que provoque sangrado o hinchazn del cerebro.  Falta de oxgeno en el cerebro. Esto puede ocurrir debido a:  Neumona grave.  Enfisema grave.  Anemia grave.  Un incidente por ahogamiento.  Bajo nivel de azcar en [employer].  Intoxicacin o sobredosis de medicamentos recetados o de H. [employer]. Heinz.  Efectos txicos del alcohol.  Efectos txicos por la acumulacin de productos de desecho en la sangre, como resultado de una insuficiencia renal o heptica. CULES SON LAS OPCIONES DE TRATAMIENTO? Una vez que la persona se encuentre fuera del peligro inmediato, la atencin del equipo mdico se Mudlogger todo lo posible en el mantenimiento de su estado fsico. El mantenimiento incluye:  Prevenir la neumona.  Prevenir las escaras.  Proporcionar una nutricin equilibrada.  Prevenir las contracciones musculares permanentes contracturas).  Prevenir deformaciones seas y articulares. Document Released: 03/02/2005 Document Revised: 11/02/2012 [facility] Patient Information 2015 [place],  [place]. This information is not intended to replace advice given to you by your health care provider. Make sure you discuss any questions you have with your health care provider.  Coma (Coma) QU ES EL COMA?  El coma es un estado de inconsciencia profunda. Una persona en estado de coma tiene vida pero no puede responder a lo que la rodea.  QU CAUSA EL COMA?  El [place] de coma puede producirse debido a:   Una progresin o complicacin esperable de una enfermedad subyacente.  Un traumatismo en la cabeza con sangrado o hinchazn, o ambos, del cerebro.  Una lesin cerebral por sangrado o hinchazn o ambos, causada por algo que no es un traumatismo. Puede ocurrir si un vaso sanguneo dbil o un aneurisma se rompen en el cerebro.  Un tumor cerebral, que puede causar un dao que provoque sangrado o hinchazn del cerebro.  Falta de oxgeno en el cerebro. Esto puede ocurrir debido a:  Neumona grave.  Enfisema grave.  Anemia grave.  Un incidente por ahogamiento.  Bajo nivel de azcar en [employer].  Intoxicacin o sobredosis de medicamentos recetados o de [employer]. [employer] Heinz.  Efectos txicos del alcohol.  Efectos txicos por la acumulacin  de productos de desecho en la sangre, como resultado de una insuficiencia renal o heptica. CULES SON LAS OPCIONES DE TRATAMIENTO? Una vez que la persona se encuentre fuera del peligro inmediato, la atencin del equipo mdico se Mudloggerconcentrar todo lo posible en el mantenimiento de su estado fsico. El mantenimiento incluye:  Prevenir la neumona.  Prevenir las escaras.  Proporcionar una nutricin equilibrada.  Prevenir las contracciones musculares permanentes contracturas).  Prevenir deformaciones seas y articulares. Document Released: 03/02/2005 Document Revised: 11/02/2012 [facility] Patient Information 2015 [place]. This information is not intended to replace advice given to you by your health care provider. Make sure you discuss  any questions you have with your health care provider.  Estado vegetativo persistente (Persistent Vegetative State) QU ES EL ESTADO VEGETATIVO PERSISTENTE?  El estado vegetativo persistente en algunos casos sigue al [place] de coma. Se refiere a Adult nurseun estado en el que la persona:   Ha perdido su capacidad de pensar.  Ha perdido la conciencia de su entorno.  Conserva una funcin no cognitiva.  Tiene ciclos de sueo y vigilia. En el estado vegetativo persistente, la persona pierde las funciones cerebrales superiores. Las funciones del tronco cerebral Scientist, clinical (histocompatibility and immunogenetics)permanecen relativamente intactas, incluyendo la respiracin y Field seismologistla circulacin. Puede haber movimientos espontneos. Los ojos pueden abrirse en respuesta a un estmulo externo, pero la persona no habla ni obedece rdenes. [place] Citypacientes en Qwest Communicationsestado vegetativo pueden parecer que se encuentran en un estado normal. En algunos casos se [place]jan, lloran o ren. QUE CAUSA EL ESTADO VEGETATIVO PERSISTENTE?   Una lesin o traumatismo en el cerebro.  Falta de oxgeno en el cerebro durante un perodo prolongado debido a un infarto o ictus.  Efectos txicos de las drogas o el alcohol.  Etapa final de la demencia. CULES SON LAS OPCIONES DE TRATAMIENTO? El [place] scientistprimer tratamiento ms importante se centra en las lesiones o enfermedades que causaron el estado vegetativo persistente. Una vez que la persona est fuera del peligro inmediato, el equipo mdico se concentra en mantener el estado fsico de Dealerla persona. Esto incluye:  Prevenir la neumona.  Prevenir las escaras.  Proporcionar una nutricin equilibrada.  Prevenir las contracciones musculares permanentes (contracturas).  Prevenir deformaciones seas y articulares. Document Released: 11/02/2012 [facility] Patient Information 2015 [place], [place]. This information is not intended to replace advice given to you by your health care provider. Make sure you discuss any questions you have with your health care  provider.  Estado vegetativo persistente (Persistent Vegetative State) QU ES EL ESTADO VEGETATIVO PERSISTENTE?  El estado vegetativo persistente en algunos casos sigue al [place]ado de coma. Se refiere a Adult nurseun estado en el que la persona:   Ha perdido su capacidad de pensar.  Ha perdido la conciencia de su entorno.  Conserva una funcin no cognitiva.  Tiene ciclos de sueo y vigilia. En el estado vegetativo persistente, la persona pierde las funciones cerebrales superiores. Las funciones del tronco cerebral Scientist, clinical (histocompatibility and immunogenetics)permanecen relativamente intactas, incluyendo la respiracin y Field seismologistla circulacin. Puede haber movimientos espontneos. Los ojos pueden abrirse en respuesta a un estmulo externo, pero la persona no habla ni obedece rdenes. [place] Citypacientes en Qwest Communicationsestado vegetativo pueden parecer que se encuentran en un estado normal. En algunos casos se [place]jan, lloran o ren. QUE CAUSA EL ESTADO VEGETATIVO PERSISTENTE?   Una lesin o traumatismo en el cerebro.  Falta de oxgeno en el cerebro durante un perodo prolongado debido a un infarto o ictus.  Efectos txicos de las drogas o el alcohol.  Etapa final de la demencia. CULES SON  LAS OPCIONES DE TRATAMIENTO? El [place] scientist ms importante se centra en las lesiones o enfermedades que causaron el estado vegetativo persistente. Una vez que la persona est fuera del peligro inmediato, el equipo mdico se concentra en mantener el estado fsico de Dealer. Esto incluye:  Prevenir la neumona.  Prevenir las escaras.  Proporcionar una nutricin equilibrada.  Prevenir las contracciones musculares permanentes (contracturas).  Prevenir deformaciones seas y articulares. Document Released: 11/02/2012 [facility] Patient Information 2015 [place], [place]. This information is not intended to replace advice given to you by your health care provider. Make sure you discuss any questions you have with your health care provider.  Coma (Coma) QU ES EL COMA?    El coma es un estado de inconsciencia profunda. Una persona en estado de coma tiene vida pero no puede responder a lo que la rodea.  QU CAUSA EL COMA?  El [place] de coma puede producirse debido a:   Una progresin o complicacin esperable de una enfermedad subyacente.  Un traumatismo en la cabeza con sangrado o hinchazn, o ambos, del cerebro.  Una lesin cerebral por sangrado o hinchazn o ambos, causada por algo que no es un traumatismo. Puede ocurrir si un vaso sanguneo dbil o un aneurisma se rompen en el cerebro.  Un tumor cerebral, que puede causar un dao que provoque sangrado o hinchazn del cerebro.  Falta de oxgeno en el cerebro. Esto puede ocurrir debido a:  Neumona grave.  Enfisema grave.  Anemia grave.  Un incidente por ahogamiento.  Bajo nivel de azcar en [employer].  Intoxicacin o sobredosis de medicamentos recetados o de H. J[employer] Heinz.  Efectos txicos del alcohol.  Efectos txicos por la acumulacin de productos de desecho en la sangre, como resultado de una insuficiencia renal o heptica. CULES SON LAS OPCIONES DE TRATAMIENTO? Una vez que la persona se encuentre fuera del peligro inmediato, la atencin del equipo mdico se Mudlogger todo lo posible en el mantenimiento de su estado fsico. El mantenimiento incluye:  Prevenir la neumona.  Prevenir las escaras.  Proporcionar una nutricin equilibrada.  Prevenir las contracciones musculares permanentes contracturas).  Prevenir deformaciones seas y articulares. Document Released: 03/02/2005 Document Revised: 11/02/2012 [facility] Patient Information 2015 [place], [place]. This information is not intended to replace advice given to you by your health care provider. Make sure you discuss any questions you have with your health care provider.  Coma (Coma) QU ES EL COMA?  El coma es un estado de inconsciencia profunda. Una persona en estado de coma tiene vida pero no puede responder a lo que  la rodea.  QU CAUSA EL COMA?  El [place] de coma puede producirse debido a:   Una progresin o complicacin esperable de una enfermedad subyacente.  Un traumatismo en la cabeza con sangrado o hinchazn, o ambos, del cerebro.  Una lesin cerebral por sangrado o hinchazn o ambos, causada por algo que no es un traumatismo. Puede ocurrir si un vaso sanguneo dbil o un aneurisma se rompen en el cerebro.  Un tumor cerebral, que puede causar un dao que provoque sangrado o hinchazn del cerebro.  Falta de oxgeno en el cerebro. Esto puede ocurrir debido a:  Neumona grave.  Enfisema grave.  Anemia grave.  Un incidente por ahogamiento.  Bajo nivel de azcar en [employer].  Intoxicacin o sobredosis de medicamentos recetados o de [employer]. [employer].  Efectos txicos del alcohol.  Efectos txicos por la acumulacin de productos de desecho en la sangre, como resultado de [place]  insuficiencia renal o heptica. CULES SON LAS OPCIONES DE TRATAMIENTO? Una vez que la persona se encuentre fuera del peligro inmediato, la atencin del equipo mdico se Mudlogger todo lo posible en el mantenimiento de su estado fsico. El mantenimiento incluye:  Prevenir la neumona.  Prevenir las escaras.  Proporcionar una nutricin equilibrada.  Prevenir las contracciones musculares permanentes contracturas).  Prevenir deformaciones seas y articulares. Document Released: 03/02/2005 Document Revised: 11/02/2012 [facility] Patient Information 2015 [place], [place]. This information is not intended to replace advice given to you by your health care provider. Make sure you discuss any questions you have with your health care provider.  Coma (Coma) QU ES EL COMA?  El coma es un estado de inconsciencia profunda. Una persona en estado de coma tiene vida pero no puede responder a lo que la rodea.  QU CAUSA EL COMA?  El [place] de coma puede producirse debido a:   Una progresin o complicacin esperable  de una enfermedad subyacente.  Un traumatismo en la cabeza con sangrado o hinchazn, o ambos, del cerebro.  Una lesin cerebral por sangrado o hinchazn o ambos, causada por algo que no es un traumatismo. Puede ocurrir si un vaso sanguneo dbil o un aneurisma se rompen en el cerebro.  Un tumor cerebral, que puede causar un dao que provoque sangrado o hinchazn del cerebro.  Falta de oxgeno en el cerebro. Esto puede ocurrir debido a:  Neumona grave.  Enfisema grave.  Anemia grave.  Un incidente por ahogamiento.  Bajo nivel de azcar en [employer].  Intoxicacin o sobredosis de medicamentos recetados o de [employer].  Efectos txicos del alcohol.  Efectos txicos por la acumulacin de productos de desecho en la sangre, como resultado de una insuficiencia renal o heptica. CULES SON LAS OPCIONES DE TRATAMIENTO? Una vez que la persona se encuentre fuera del peligro inmediato, la atencin del equipo mdico se Mudlogger todo lo posible en el mantenimiento de su estado fsico. El mantenimiento incluye:  Prevenir la neumona.  Prevenir las escaras.  Proporcionar una nutricin equilibrada.  Prevenir las contracciones musculares permanentes contracturas).  Prevenir deformaciones seas y articulares. Document Released: 03/02/2005 Document Revised: 11/02/2012 [facility] Patient Information 2015 [place], [place]. This information is not intended to replace advice given to you by your health care provider. Make sure you discuss any questions you have with your health care provider.  Coma (Coma) QU ES EL COMA?  El coma es un estado de inconsciencia profunda. Una persona en estado de coma tiene vida pero no puede responder a lo que la rodea.  QU CAUSA EL COMA?  El [place] de coma puede producirse debido a:   Una progresin o complicacin esperable de una enfermedad subyacente.  Un traumatismo en la cabeza con sangrado o hinchazn, o ambos, del cerebro.  Una lesin  cerebral por sangrado o hinchazn o ambos, causada por algo que no es un traumatismo. Puede ocurrir si un vaso sanguneo dbil o un aneurisma se rompen en el cerebro.  Un tumor cerebral, que puede causar un dao que provoque sangrado o hinchazn del cerebro.  Falta de oxgeno en el cerebro. Esto puede ocurrir debido a:  Neumona grave.  Enfisema grave.  Anemia grave.  Un incidente por ahogamiento.  Bajo nivel de azcar en [employer].  Intoxicacin o sobredosis de medicamentos recetados o de [employer].  Efectos txicos del alcohol.  Efectos txicos por la acumulacin de productos de desecho en la sangre, como resultado de una insuficiencia renal o heptica.  CULES SON LAS OPCIONES DE TRATAMIENTO? Una vez que la persona se encuentre fuera del peligro inmediato, la atencin del equipo mdico se Mudlogger todo lo posible en el mantenimiento de su estado fsico. El mantenimiento incluye:  Prevenir la neumona.  Prevenir las escaras.  Proporcionar una nutricin equilibrada.  Prevenir las contracciones musculares permanentes contracturas).  Prevenir deformaciones seas y articulares. Document Released: 03/02/2005 Document Revised: 11/02/2012 [facility] Patient Information 2015 [place], [place]. This information is not intended to replace advice given to you by your health care provider. Make sure you discuss any questions you have with your health care provider.  Coma (Coma) QU ES EL COMA?  El coma es un estado de inconsciencia profunda. Una persona en estado de coma tiene vida pero no puede responder a lo que la rodea.  QU CAUSA EL COMA?  El [place] de coma puede producirse debido a:   Una progresin o complicacin esperable de una enfermedad subyacente.  Un traumatismo en la cabeza con sangrado o hinchazn, o ambos, del cerebro.  Una lesin cerebral por sangrado o hinchazn o ambos, causada por algo que no es un traumatismo. Puede ocurrir si un vaso sanguneo  dbil o un aneurisma se rompen en el cerebro.  Un tumor cerebral, que puede causar un dao que provoque sangrado o hinchazn del cerebro.  Falta de oxgeno en el cerebro. Esto puede ocurrir debido a:  Neumona grave.  Enfisema grave.  Anemia grave.  Un incidente por ahogamiento.  Bajo nivel de azcar en [employer].  Intoxicacin o sobredosis de medicamentos recetados o de [employer].  Efectos txicos del alcohol.  Efectos txicos por la acumulacin de productos de desecho en la sangre, como resultado de una insuficiencia renal o heptica. CULES SON LAS OPCIONES DE TRATAMIENTO? Una vez que la persona se encuentre fuera del peligro inmediato, la atencin del equipo mdico se Mudlogger todo lo posible en el mantenimiento de su estado fsico. El mantenimiento incluye:  Prevenir la neumona.  Prevenir las escaras.  Proporcionar una nutricin equilibrada.  Prevenir las contracciones musculares permanentes contracturas).  Prevenir deformaciones seas y articulares. Document Released: 03/02/2005 Document Revised: 11/02/2012 [facility] Patient Information 2015 [place], [place]. This information is not intended to replace advice given to you by your health care provider. Make sure you discuss any questions you have with your health care provider.  Coma (Coma) QU ES EL COMA?  El coma es un estado de inconsciencia profunda. Una persona en estado de coma tiene vida pero no puede responder a lo que la rodea.  QU CAUSA EL COMA?  El [place] de coma puede producirse debido a:   Una progresin o complicacin esperable de una enfermedad subyacente.  Un traumatismo en la cabeza con sangrado o hinchazn, o ambos, del cerebro.  Una lesin cerebral por sangrado o hinchazn o ambos, causada por algo que no es un traumatismo. Puede ocurrir si un vaso sanguneo dbil o un aneurisma se rompen en el cerebro.  Un tumor cerebral, que puede causar un dao que provoque sangrado o  hinchazn del cerebro.  Falta de oxgeno en el cerebro. Esto puede ocurrir debido a:  Neumona grave.  Enfisema grave.  Anemia grave.  Un incidente por ahogamiento.  Bajo nivel de azcar en [employer].  Intoxicacin o sobredosis de medicamentos recetados o de [employer].  Efectos txicos del alcohol.  Efectos txicos por la acumulacin de productos de desecho en la sangre, como resultado de una insuficiencia renal o heptica. CULES SON LAS OPCIONES  DE TRATAMIENTO? Una vez que la persona se encuentre fuera del peligro inmediato, la atencin del equipo mdico se Mudlogger todo lo posible en el mantenimiento de su estado fsico. El mantenimiento incluye:  Prevenir la neumona.  Prevenir las escaras.  Proporcionar una nutricin equilibrada.  Prevenir las contracciones musculares permanentes contracturas).  Prevenir deformaciones seas y articulares. Document Released: 03/02/2005 Document Revised: 11/02/2012 [facility] Patient Information 2015 [place], [place]. This information is not intended to replace advice given to you by your health care provider. Make sure you discuss any questions you have with your health care provider.  Coma (Coma) QU ES EL COMA?  El coma es un estado de inconsciencia profunda. Una persona en estado de coma tiene vida pero no puede responder a lo que la rodea.  QU CAUSA EL COMA?  El [place] de coma puede producirse debido a:   Una progresin o complicacin esperable de una enfermedad subyacente.  Un traumatismo en la cabeza con sangrado o hinchazn, o ambos, del cerebro.  Una lesin cerebral por sangrado o hinchazn o ambos, causada por algo que no es un traumatismo. Puede ocurrir si un vaso sanguneo dbil o un aneurisma se rompen en el cerebro.  Un tumor cerebral, que puede causar un dao que provoque sangrado o hinchazn del cerebro.  Falta de oxgeno en el cerebro. Esto puede ocurrir debido a:  Neumona grave.  Enfisema  grave.  Anemia grave.  Un incidente por ahogamiento.  Bajo nivel de azcar en [employer].  Intoxicacin o sobredosis de medicamentos recetados o de [employer].  Efectos txicos del alcohol.  Efectos txicos por la acumulacin de productos de desecho en la sangre, como resultado de una insuficiencia renal o heptica. CULES SON LAS OPCIONES DE TRATAMIENTO? Una vez que la persona se encuentre fuera del peligro inmediato, la atencin del equipo mdico se Mudlogger todo lo posible en el mantenimiento de su estado fsico. El mantenimiento incluye:  Prevenir la neumona.  Prevenir las escaras.  Proporcionar una nutricin equilibrada.  Prevenir las contracciones musculares permanentes contracturas).  Prevenir deformaciones seas y articulares. Document Released: 03/02/2005 Document Revised: 11/02/2012 [facility] Patient Information 2015 [place], [place]. This information is not intended to replace advice given to you by your health care provider. Make sure you discuss any questions you have with your health care provider.  Coma (Coma) QU ES EL COMA?  El coma es un estado de inconsciencia profunda. Una persona en estado de coma tiene vida pero no puede responder a lo que la rodea.  QU CAUSA EL COMA?  El [place] de coma puede producirse debido a:   Una progresin o complicacin esperable de una enfermedad subyacente.  Un traumatismo en la cabeza con sangrado o hinchazn, o ambos, del cerebro.  Una lesin cerebral por sangrado o hinchazn o ambos, causada por algo que no es un traumatismo. Puede ocurrir si un vaso sanguneo dbil o un aneurisma se rompen en el cerebro.  Un tumor cerebral, que puede causar un dao que provoque sangrado o hinchazn del cerebro.  Falta de oxgeno en el cerebro. Esto puede ocurrir debido a:  Neumona grave.  Enfisema grave.  Anemia grave.  Un incidente por ahogamiento.  Bajo nivel de azcar en [employer].  Intoxicacin o  sobredosis de medicamentos recetados o de [employer].  Efectos txicos del alcohol.  Efectos txicos por la acumulacin de productos de desecho en la sangre, como resultado de una insuficiencia renal o heptica. CULES SON LAS OPCIONES DE TRATAMIENTO? Pollyann Savoy  que la persona se encuentre fuera del peligro inmediato, la atencin del equipo mdico se Mudlogger todo lo posible en el mantenimiento de su estado fsico. El mantenimiento incluye:  Prevenir la neumona.  Prevenir las escaras.  Proporcionar una nutricin equilibrada.  Prevenir las contracciones musculares permanentes contracturas).  Prevenir deformaciones seas y articulares. Document Released: 03/02/2005 Document Revised: 11/02/2012 [facility] Patient Information 2015 [place], [place]. This information is not intended to replace advice given to you by your health care provider. Make sure you discuss any questions you have with your health care provider.  Coma (Coma) QU ES EL COMA?  El coma es un estado de inconsciencia profunda. Una persona en estado de coma tiene vida pero no puede responder a lo que la rodea.  QU CAUSA EL COMA?  El [place] de coma puede producirse debido a:   Una progresin o complicacin esperable de una enfermedad subyacente.  Un traumatismo en la cabeza con sangrado o hinchazn, o ambos, del cerebro.  Una lesin cerebral por sangrado o hinchazn o ambos, causada por algo que no es un traumatismo. Puede ocurrir si un vaso sanguneo dbil o un aneurisma se rompen en el cerebro.  Un tumor cerebral, que puede causar un dao que provoque sangrado o hinchazn del cerebro.  Falta de oxgeno en el cerebro. Esto puede ocurrir debido a:  Neumona grave.  Enfisema grave.  Anemia grave.  Un incidente por ahogamiento.  Bajo nivel de azcar en [employer].  Intoxicacin o sobredosis de medicamentos recetados o de [employer].  Efectos txicos del alcohol.  Efectos txicos por la  acumulacin de productos de desecho en la sangre, como resultado de una insuficiencia renal o heptica. CULES SON LAS OPCIONES DE TRATAMIENTO? Una vez que la persona se encuentre fuera del peligro inmediato, la atencin del equipo mdico se Mudlogger todo lo posible en el mantenimiento de su estado fsico. El mantenimiento incluye:  Prevenir la neumona.  Prevenir las escaras.  Proporcionar una nutricin equilibrada.  Prevenir las contracciones musculares permanentes contracturas).  Prevenir deformaciones seas y articulares. Document Released: 03/02/2005 Document Revised: 11/02/2012 [facility] Patient Information 2015 [place], [place]. This information is not intended to replace advice given to you by your health care provider. Make sure you discuss any questions you have with your health care provider.  Coma (Coma) QU ES EL COMA?  El coma es un estado de inconsciencia profunda. Una persona en estado de coma tiene vida pero no puede responder a lo que la rodea.  QU CAUSA EL COMA?  El [place] de coma puede producirse debido a:   Una progresin o complicacin esperable de una enfermedad subyacente.  Un traumatismo en la cabeza con sangrado o hinchazn, o ambos, del cerebro.  Una lesin cerebral por sangrado o hinchazn o ambos, causada por algo que no es un traumatismo. Puede ocurrir si un vaso sanguneo dbil o un aneurisma se rompen en el cerebro.  Un tumor cerebral, que puede causar un dao que provoque sangrado o hinchazn del cerebro.  Falta de oxgeno en el cerebro. Esto puede ocurrir debido a:  Neumona grave.  Enfisema grave.  Anemia grave.  Un incidente por ahogamiento.  Bajo nivel de azcar en [employer].  Intoxicacin o sobredosis de medicamentos recetados o de [employer].  Efectos txicos del alcohol.  Efectos txicos por la acumulacin de productos de desecho en la sangre, como resultado de una insuficiencia renal o heptica. CULES SON LAS  OPCIONES DE TRATAMIENTO? Una vez que la persona se  encuentre fuera del peligro inmediato, la atencin del equipo mdico se Mudlogger todo lo posible en el mantenimiento de su estado fsico. El mantenimiento incluye:  Prevenir la neumona.  Prevenir las escaras.  Proporcionar una nutricin equilibrada.  Prevenir las contracciones musculares permanentes contracturas).  Prevenir deformaciones seas y articulares. Document Released: 03/02/2005 Document Revised: 11/02/2012 [facility] Patient Information 2015 [place], [place]. This information is not intended to replace advice given to you by your health care provider. Make sure you discuss any questions you have with your health care provider.  Estado vegetativo persistente (Persistent Vegetative State) QU ES EL ESTADO VEGETATIVO PERSISTENTE?  El estado vegetativo persistente en algunos casos sigue al [place] de coma. Se refiere a Adult nurse la persona:   Ha perdido su capacidad de pensar.  Ha perdido la conciencia de su entorno.  Conserva una funcin no cognitiva.  Tiene ciclos de sueo y vigilia. En el estado vegetativo persistente, la persona pierde las funciones cerebrales superiores. Las funciones del tronco cerebral Scientist, clinical (histocompatibility and immunogenetics) intactas, incluyendo la respiracin y Field seismologist. Puede haber movimientos espontneos. Los ojos pueden abrirse en respuesta a un estmulo externo, pero la persona no habla ni obedece rdenes. [place] en [employer] vegetativo pueden parecer que se encuentran en un estado normal. En algunos casos se [place], lloran o ren. QUE CAUSA EL ESTADO VEGETATIVO PERSISTENTE?   Una lesin o traumatismo en el cerebro.  Falta de oxgeno en el cerebro durante un perodo prolongado debido a un infarto o ictus.  Efectos txicos de las drogas o el alcohol.  [place] final de la demencia. CULES SON LAS OPCIONES DE TRATAMIENTO? El Marine scientist ms importante se centra en las lesiones o  enfermedades que causaron el estado vegetativo persistente. Una vez que la persona est fuera del peligro inmediato, el equipo mdico se concentra en mantener el estado fsico de Dealer. Esto incluye:  Prevenir la neumona.  Prevenir las escaras.  Proporcionar una nutricin equilibrada.  Prevenir las contracciones musculares permanentes (contracturas).  Prevenir deformaciones seas y articulares. Document Released: 11/02/2012 [facility] Patient Information 2015 [place], [place]. This information is not intended to replace advice given to you by your health care provider. Make sure you discuss any questions you have with your health care provider.  Estado vegetativo persistente (Persistent Vegetative State) QU ES EL ESTADO VEGETATIVO PERSISTENTE?  El estado vegetativo persistente en algunos casos sigue al [place] de coma. Se refiere a Adult nurse la persona:   Ha perdido su capacidad de pensar.  Ha perdido la conciencia de su entorno.  Conserva una funcin no cognitiva.  Tiene ciclos de sueo y vigilia. En el estado vegetativo persistente, la persona pierde las funciones cerebrales superiores. Las funciones del tronco cerebral Scientist, clinical (histocompatibility and immunogenetics) intactas, incluyendo la respiracin y Field seismologist. Puede haber movimientos espontneos. Los ojos pueden abrirse en respuesta a un estmulo externo, pero la persona no habla ni obedece rdenes. [place] en [employer] vegetativo pueden parecer que se encuentran en un estado normal. En algunos casos se [place], lloran o ren. QUE CAUSA EL ESTADO VEGETATIVO PERSISTENTE?   Una lesin o traumatismo en el cerebro.  Falta de oxgeno en el cerebro durante un perodo prolongado debido a un infarto o ictus.  Efectos txicos de las drogas o el alcohol.  [place] final de la demencia. CULES SON LAS OPCIONES DE TRATAMIENTO? El Marine scientist ms importante se centra en las lesiones o enfermedades que causaron el estado vegetativo  persistente. Una vez que la persona  est fuera del peligro inmediato, el equipo mdico se Production manager fsico de Dealer. Esto incluye:  Prevenir la neumona.  Prevenir las escaras.  Proporcionar una nutricin equilibrada.  Prevenir las contracciones musculares permanentes (contracturas).  Prevenir deformaciones seas y articulares. Document Released: 11/02/2012 [facility] Patient Information 2015 [place], [place]. This information is not intended to replace advice given to you by your health care provider. Make sure you discuss any questions you have with your health care provider.

## 2014-07-15 NOTE — H&P (Signed)
PATIENT NAME:  Annette, Howard MR#:  [id number] DATE OF BIRTH:  July 04, 1955  DATE OF ADMISSION:  07/12/2014  ADMITTING PHYSICIAN:  Harrington Challenger, M.D.   PRIMARY CARE MEDICAL DOCTOR: At [facility].  PRIMARY HEPATOLOGIST:  Also at [facility].   CHIEF COMPLAINT: Altered mental status.   HISTORY OF PRESENT ILLNESS: Annette Howard is a 59 year old Hispanic female with a past medical history significant for history of hepatitis C, liver cirrhosis, gastric fundic varices with anemia, chronic neck pain, history of significant gastric ulcers, new diagnosis of diabetes mellitus recently presented from home secondary to unresponsive episode.  Most of the history is obtained from her daughter at bedside, but her son is her primary caregiver and he is not available at this time. The patient lives at home with her son.  According to the daughter, when the patient is not sick she is usually active and gets around the house.  Has had multiple hospitalizations recently.  Last hospitalization was about 2 weeks ago at [facility] for unknown reason and patient had to stay for almost a week and just got discharged home a week ago. Yesterday, she was doing fine. She went to [facility] ED for therapeutic paracentesis and had almost about 3.5 liters taken out. She was supposed to follow up for a infusion at [facility] today. The patient has been on lactulose and takes her medications regularly and does have good bowel movements.  When family woke up this morning, they found her unresponsive in bed and brought her to the Emergency Room.  Here, she is found to have an ammonia level as high as 257.  No hematemesis or melena reported.  Belly feels soft. The patient has not been sick otherwise with nausea and vomiting in the last week since her discharge.  The patient was intubated in the emergency room and is currently on a ventilator.  PAST MEDICAL HISTORY:  1.  Hepatitis C.  2.  Chronic liver cirrhosis.  3.  History of SBP and MSSA bacteremia in July  2015.  4.  Chronic neck pain.  5.  Gastric ulcer disease.  6.  Gastric fundus varices. 7.  Chronic anemia.  8.  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  PAST SURGICAL HISTORY:  Hysterectomy and right eye surgery.   ALLERGIES TO MEDICATIONS: No known drug allergies.   CURRENT HOME MEDICATIONS: 1.  Rifaximin 550 mg p.o. b.i.d.  2.  Propranolol 20 mg p.o. b.i.d.  3.  Prilosec 40 mg p.o. daily.  4.  Guaifenesin 400 mg p.o. b.i.d.  5.  Tylenol 650 mg q. 8 hours p.r.n. for pain to a maximum of only 2 grams per day.  6.  Lactulose sodium b.i.d.  SOCIAL HISTORY: Lives at home with her son. No use of any smoking or alcohol.   FAMILY HISTORY: Grandfather and father with liver trouble.  REVIEW OF SYSTEMS:  Difficult to be obtained secondary to the patient being intubated and on ventilator.  PHYSICAL EXAMINATION:  VITAL SIGNS: Temperature is afebrile, pulse 64, respirations 16, blood pressure is 104/50, pulse of 88% on oxygen.  GENERAL:  Critically ill-appearing female lying in bed on the ventilator and sedated.  HEENT: Normocephalic.  Right cornea shows some distortion as if she had a prior trauma or surgery done.  The patient is legally blind in that eye, irregular pupil.  The left eye has 3 mm pupil with sluggish reaction to light. Oropharynx.  Orally intubated.  Orogastric tube is present and creamy bubbly secretions in the ET tube  noted.  NECK: Supple. No thyromegaly, JVD, or carotid bruits.  LUNGS: Course rhonchi heard bilaterally on exam.  CARDIOVASCULAR: S1, S2, regular rate and rhythm. A 3/6 systolic murmur heard. No rubs or gallops.  ABDOMEN: Soft at this time. Mildly distended. No hepatosplenomegaly. Normal bowel sounds.  EXTREMITIES: She does have 3+ edema on bilateral lower extremities.  Difficult to get a dorsalis pedis pulse.   SKIN:  Vitiligo patches spread all over her lower extremities, arms, and also on her trunk.  LYMPHATICS: No cervical lymphadenopathy.  NEUROLOGIC: The patient  is lethargic without any sedation, intubated on ventilator at this time. Occasional jerking motions are noted.  PSYCHOLOGIC: Difficult to assess as the patient is on the ventilator.   LABORATORY DATA:  ABG showing pH of 7.588, pCO2 22, O2 of 376, bicarbonate of 20.6 and saturations of 100% on 75% FiO2. INR is 1.6, PTT 28.6.   CK is 68, CK-MB 1.2. Troponin is negative.   WBC 6.8, hemoglobin 8.9, hematocrit 26.7, platelet count 62,000.   Sodium 136, potassium 5.5, chloride 109, bicarbonate 21, BUN 24, creatinine 1.07, glucose 128, and calcium of 7.6.   ALT 47, AST 80, alkaline phosphatase 194, total bilirubin 5.1, albumin of 3.5.  Ammonia is elevated at 257. BNP is 103.  Lactic acid is 2.4. Urinalysis with 2+ leukocyte esterase, few WBCs, but no bacteria seen.  TSH is 2.4. Serum alcohol level is negative. Urine toxicology screen is negative. Serum Salicylates and acetaminophen level is negative. Magnesium is 1.8.   Chest x-ray showing endotracheal tube.  Tip is 3 cm away from the carina and tracheostomy tube in the stomach, low lung volumes. No consolidation, nonspecific diffuse interstitial prominence noted. CT of the head without contrast showing no evidence of acute intracranial abnormality noted.   EKG showing normal sinus rhythm, heart rate of 68. No acute ST-T wave abnormalities.   ASSESSMENT AND PLAN: This is a 59 year old female Hispanic female with past medical history significant for hepatitis C, liver cirrhosis, chronic thrombocytopenia, prior gastric varices, significant ascites with [facility] comes to the hospital secondary to altered mental status and noted to have an ammonia of 257.  1.  Acute hepatic encephalopathy, very lethargic.  Ammonia is 257. The patient is intubated on ventilator.  Orogastric tube is in place.  Will start lactulose q. 4 hours with a rectal tube in place. Repeat ammonia in a.m.  Gastroenterology has been consulted.  Not sure if patient  had a TIPS procedure done in the past or not.  2.  Respiratory failure. Intubated for airway protection. Pulmonary has been consulted for ventilator management.  If she wakes up, will place on sedation in case needed.  Continue to wean off the ventilator.  She does have coarse rhonchi on exam, thought X-rays not clear.  Will give her a dose of Lasix and repeat x-ray in the morning. Followup repeat ABG again.  3.  Chronic anemia and thrombocytopenia secondary to liver cirrhosis. They seem to be stable at this time. No active bleeding.  Continue to monitor.  4.  Acute renal failure possibly from acute tubular necrosis.  Continue to monitor, especially Lasix is being given.  Could be hepatorenal syndrome not showed at this time. Liver function tests are not significantly elevated from baseline.   5.  Ascites status post paracentesis done yesterday, 3.5 liters taken out.  Abdomen is soft with elevated lactic acid, altered mental status. For now, will we will cover with Rocephin until gastroenterology will  say otherwise.  6.  Hepatitis C and liver cirrhosis. Follows up with a hepatologist at [facility].  Option was presented whether they wanted to continue here or be transferred to [facility].  Family decided to stay here at this time. Continue her Xifaxan, lactulose, also on propranolol.  7.  History of peptic ulcer disease on Protonix.  8.  Code status.  The patient is a full code.   Total critical care time spent on admission Of this patient is 65 minutes.   ____________________________ Enid Baas, MD rk:sp D: 07/12/2014 11:49:47 ET T: 07/12/2014 12:35:04 ET JOB#: [id number]  cc: Enid Baas, MD, <Dictator> Enid Baas MD ELECTRONICALLY SIGNED 07/12/2014 18:23

## 2014-07-15 NOTE — Care Management Note (Signed)
Case Management Note  Patient Details  Name: Annette Howard MRN: [id number] Date of Birth: [birth year]  Subjective/Objective:                    Action/Plan:   Expected Discharge Date:                  Expected Discharge Plan:     In-House Referral:     Discharge planning Services     Post Acute Care Choice:    Choice offered to:     DME Arranged:    DME Agency:     HH Arranged:    HH Agency:     Status of Service:     Medicare Important Message Given:    Date Medicare IM Given:    Medicare IM give by:    Date Additional Medicare IM Given:    Additional Medicare Important Message give by:     If discussed at Long Length of Stay Meetings, dates discussed:    Additional Comments:  Annette Howard, Annette Schwebke Diane, RN 07/15/2014, 12:45 PM

## 2014-07-15 NOTE — Consult Note (Signed)
Brief Consult Note: Diagnosis: unresponsiveness.   Patient was seen by consultant.   Consult note dictated.   Comments: Appreciate consult for 59 y/o Hispanic woman with hx HCV cirrhosis, gastric varices, anemia, chronic neck pain, DM, PUD, for evaluation of hepatic encephalopathy/cirrhosis. [facility]- DIL thinks it is with Dr Julieta GuttingHayashi. Last there yesterday for paracentesis- about 6l fluid was removed per DIL report.  Has been requiring paracenteses on a weekly basis for the last month or so. Patient is intubated due to respiratory insufficiency/HE, hx from chart and DIL. Reports that patient was feeling well yesterday after procedure when she went home, had no unusual events, and has been very adherent to her lactulose/xifaxan meds at home. Also on spironolactone, lasix, propranolol.  There has been some mild constipation, but no known black tarry stools, or rectal bleeding, Prior to paracentesis she had generalized abdominal discomfort and sob, but this imrpoved after procedure. Was seen last month for AMS in the ED, transferred to [facility], found to have constipation and treated with stool softeners with imrpovement per daughter's report. Currently with hgb 8.9 (last month was 11), has UTI, renal function somewhat decreased. No recent liver imaging here. CT head neg. Being treated with lactulose q4h, rocephin, levaquin, H2RA and PPI. Cardiac ez negative, UDS negative acetaminophen/salicylates neg, ethanol <5. T bili 5.1, AST 80, ALT 47, ALP194, INR 1.477m platelets 62. NH4 257, Lactate 2.4. Afeb & VSS Impression/plan: CTP class c/MELD 20 cirrhosis. HE exacerbation: ddx includes hypovolemia, possible bleeding event, infection, declining renal status, constipation, HCC/vascular occlusion, and worsening liver disease. Will assess hgb, stool for occult blood, us with doppler studies, and [facility]. Recommend continuing to support hydration, optimize nutrition, correct electrolytes,  observe for bleeding. Agree with current lactulose treatment..  Electronic Signatures: Vevelyn PatLondon, Christiane H (NP)  (Signed 28-Apr-16 16:58)  Authored: Brief Consult Note   Last Updated: 28-Apr-16 16:58 by Keturah BarreLondon, Christiane H (NP)

## 2014-07-15 NOTE — Discharge Summary (Signed)
Annette Howard, is a 59 y.o. female  DOB 1956/03/04  MRN [id number].  Admission date:  07/12/2014  Admitting Physician  Enid Baas, MD  Discharge Date:  07/15/2014   Primary MD  No primary care provider on file.    Admission Diagnosis  Hepatic Encephalopathy    Discharge Diagnosis  Hepatic Encephalopathy     Active Problems:   Hepatic encephalopathy      No past medical history on file.  No past surgical history on file.     History of present illness and  Hospital Course:     Kindly see H&P for history of present illness and admission details, please review complete Labs, Consult reports and Test reports for all details in brief  HPI  from the history and physical done on the day of admission    Hospital Course  59 year old female Hispanic female with past medical history significant for hepatitis C, liver cirrhosis, chronic thrombocytopenia, prior gastric varices, significant ascites with paracentesis done 4/27 at [facility] comes to the hospital secondary to altered mental status and noted to have an ammonia of 257.  1.  Acute hepatic encephalopathy: improved Due to loose stools lactulose was on hold Loose stools have improved has [facility] GI f/u 5/4 LFTs elevated from underlying dx 2.  Acute Respiratory failure: Patient  was Intubated for airway protection.  extubated 4/30   doing well resp wise 3.  Chronic anemia and thrombocytopenia secondary to liver cirrhosis. No active bleeding.   4.  Acute renal failure possibly from acute tubular necrosis. improved 5.  Ascites status post paracentesis done at [facility] 4/27, 3.5 liters taken out. ABD u/s shows mod ascites pt not c/o pain or fluid  Discharge Condition: stable   Follow UP  Follow-up Information    Follow up with your [facility] GI MD. Schedule an  appointment as soon as possible for a visit in 1 week.        Discharge Instructions  and  Discharge Medications     Discharge Instructions    Diet - low sodium heart healthy    Complete by:  As directed      Diet - low sodium heart healthy    Complete by:  As directed      Discharge instructions    Complete by:  As directed   Follow with Primary MD No primary care provider on file. in 7 days   Get CBC, CMP checked  by Primary MD next visit.    Activity: As tolerated with Full fall precautions use walker/cane & assistance as needed   Disposition Home  With home health   Diet: Heart Healthy low sodium.  For Heart failure/liver cirrhosis patients - Check your Weight same time everyday, if you gain over 2 pounds, or you develop in leg swelling, experience more shortness of breath or chest pain, call your Primary MD immediately. Follow Cardiac Low Salt Diet and 1.5 lit/day fluid restriction.   On your next visit with your primary care physician please Get Medicines  reviewed and adjusted.   Please request your Prim.MD to go over all Hospital Tests and Procedure/Radiological results at the follow up, please get all Hospital records sent to your Prim MD by signing hospital release before you go home.   If you experience worsening of your admission symptoms, develop shortness of breath, life threatening emergency, suicidal or homicidal thoughts you must seek medical attention immediately by calling 911 or calling your MD immediately  if symptoms less severe.  You Must read complete instructions/literature along with all the possible adverse reactions/side effects for all the Medicines you take and that have been prescribed to you. Take any new Medicines after you have completely understood and accpet all the possible adverse reactions/side effects.   Do not drive, operating heavy machinery, perform activities at heights, swimming or participation in water activities or provide baby  sitting services if your were admitted for syncope or siezures until you have seen by Primary MD or a Neurologist and advised to do so again.  Do not drive when taking Pain medications.     Face-to-face encounter (required for Medicare/Medicaid patients)    Complete by:  As directed   I Tashiana Lamarca certify that this patient is under my care and that I, or a nurse practitioner or physician's assistant working with me, had a face-to-face encounter that meets the physician face-to-face encounter requirements with this patient on 07/15/2014. The encounter with the patient was in whole, or in part for the following medical condition(s) which is the primary reason for home health care (List medical condition): hepatic encephalopathy and cirrhosis  The encounter with the patient was in whole, or in part, for the following medical condition, which is the primary reason for home health care:  encephalopathy hepatic  I certify that, based on my findings, the following services are medically necessary home health services:   Nursing Physical therapy    Reason for Medically Necessary Home Health Services:  Skilled Nursing- Skilled Assessment/Observation  My clinical findings support the need for the above services:  Shortness of breath with activity  Further, I certify that my clinical findings support that this patient is homebound due to:  Unsafe ambulation due to balance issues     Home Health    Complete by:  As directed   To provide the following care/treatments:   PT RN       Increase activity slowly    Complete by:  As directed      Increase activity slowly    Complete by:  As directed             Medication List    STOP taking these medications        propranolol 20 MG tablet  Commonly known as:  INDERAL     spironolactone 25 MG tablet  Commonly known as:  ALDACTONE      TAKE these medications        furosemide 40 MG tablet  Commonly known as:  LASIX  Take 40 mg by mouth 2 (two)  times daily.     omeprazole 20 MG capsule  Commonly known as:  PRILOSEC  Take 20 mg by mouth daily.     ondansetron 4 MG disintegrating tablet  Commonly known as:  ZOFRAN-ODT  Take 4 mg by mouth every 8 (eight) hours as needed for nausea or vomiting.     rifaximin 550 MG Tabs tablet  Commonly known as:  XIFAXAN  Place 1 tablet (550 mg total) into feeding  tube 2 (two) times daily.          Diet and Activity recommendation: See Discharge Instructions above   Consults obtained -GI  Major procedures and Radiology Reports - PLEASE review detailed and final reports for all details, in brief -      [place] Abdomen Complete  07/13/2014   CLINICAL DATA:  Ascites, cirrhosis.  Evaluate portal vein patency.  EXAM: ULTRASOUND ABDOMEN COMPLETE  COMPARISON:  Liver MRI 04/11/2013  FINDINGS: Gallbladder: Surgically absent  Common bile duct: Diameter: 5 mm  Liver: Marked cirrhosis with small liver, surface lobulation, and heterogeneous echotexture. No focal mass lesion is seen. There is antegrade flow in the main portal vein. Antegrade flow in the 2 imaged hepatic veins. Moderate simple appearing ascites.  IVC: No abnormality visualized.  Pancreas: Not visualized due to bowel gas.  Spleen: Portal hypertension with chronic enlargement, 12 cm in length by 6 cm in thickness.  Right Kidney: Length: 10 cm. Echogenicity within normal limits. No mass or hydronephrosis visualized.  Left Kidney: Length: 11 cm. Echogenicity within normal limits. No mass or hydronephrosis visualized.  Abdominal aorta: No aneurysm visualized.  Other findings: None.  IMPRESSION: 1. Patent and antegrade main portal vein. 2. Cirrhosis with moderate ascites.   Electronically Signed   By: Marnee SpringJonathon  Watts M.D.   On: 07/13/2014 10:05   Dg Chest Port 1 View  07/13/2014   CLINICAL DATA:  Follow-up of CHF, ventilated patient.  EXAM: PORTABLE CHEST - 1 VIEW  COMPARISON:  Chest x-ray of July 12, 2014  FINDINGS: The lungs are better inflated  today. The cardiac silhouette is top-normal. The pulmonary vascularity is prominent centrally. There is no significant interstitial edema. There is no pleural effusion or pneumothorax. The endotracheal tube tip lies 3.7 cm above the carina. The esophagogastric tube tip projects below the GE junction.  IMPRESSION: Improved aeration of both lungs with decreased interstitial prominence. There is no pneumonia nor pulmonary edema. The support tubes are in reasonable position.   Electronically Signed   By: David  SwazilandJordan M.D.   On: 07/13/2014 07:28   Dg Chest Port 1 View  07/12/2014   CLINICAL DATA:  Tube placement.  EXAM: PORTABLE CHEST - 1 VIEW  COMPARISON:  06/27/2014.  FINDINGS: Endotracheal tube tip is 36 mm from the carina. Enteric tube is present doubled back upon itself within the stomach. The tip of the enteric tube is at the cardia of the stomach. Monitoring leads project over the chest. Low lung volumes are present. Nonspecific diffuse interstitial prominence is present. No focal consolidation. No effusion.  IMPRESSION: Endotracheal tube tip 36 mm from the carina. Enteric tube in the stomach.   Electronically Signed   By: Andreas NewportGeoffrey  Lamke M.D.   On: 07/12/2014 10:06   [place] Attempted Paracentesis ([facility] Hx)  07/13/2014   CLINICAL DATA:  Evaluate for ascites. Paracentesis was performed yesterday yielding 6 L.  EXAM: LIMITED ABDOMEN ULTRASOUND FOR ASCITES  TECHNIQUE: Limited ultrasound survey for ascites was performed in all four abdominal quadrants.  COMPARISON:  None.  FINDINGS: There is a small amount of ascites present.  IMPRESSION: Small amount of ascites is present. [place] will be contacted regarding the diagnostic value of yesterday's paracentesis. If desired, a diagnostic tap can be performed.   Electronically Signed   By: Jolaine ClickArthur  Hoss M.D.   On: 07/13/2014 07:46   Ct Head Limited W/o Cm  07/12/2014   CLINICAL DATA:  Patient found unresponsive this morning.  EXAM: CT HEAD WITHOUT CONTRAST  TECHNIQUE: Contiguous axial images were obtained from the base of the skull through the vertex without intravenous contrast.  COMPARISON:  Head CT scan 08/16/2009.  FINDINGS: There is no evidence of acute intracranial abnormality including hemorrhage, infarct, mass lesion, mass effect, midline shift or abnormal extra-axial fluid collection. No hydrocephalus or pneumocephalus. The calvarium is intact. Imaged paranasal sinuses and mastoid air cells are unremarkable.  IMPRESSION: Negative exam.   Electronically Signed   By: Drusilla Kanner M.D.   On: 07/12/2014 10:27    Micro Results     Recent Results (from the past 240 hour(s))  Culture, blood (single)     Status: None (Preliminary result)   Collection Time: 07/12/14 11:17 AM  Result Value Ref Range Status   Micro Text Report   Preliminary       COMMENT                   NO GROWTH IN 48 HOURS   ANTIBIOTIC                                                      Clostridium Difficile [facility])     Status: None   Collection Time: 07/14/14  8:06 AM  Result Value Ref Range Status   Micro Text Report   Final       C.DIFFICILE ANTIGEN       C.DIFFICILE GDH ANTIGEN : POSITIVE   C.DIFFICILE TOXIN A/B     C.DIFFICILE TOXINS A AND B : NEGATIVE   PCR FOR TOXIGENIC C.DIFF  PCR FOR TOXIGENIC C.DIFFICILE : POSITIVE   INTERPRETATION            Positive for toxigenic C. difficile, active toxin production NOT detected.  Patient has toxigenic C. difficile organisms present in the bowel, but toxin was not detected.  The patient may be a carrier or the level of toxin in  the sample was below the limit of detection. This information should be used in conjunction with the patient's clinical history when deciding on possible therapy.   ANTIBIOTIC                                                           Today   Subjective:   Annette Howard today has no issues wants to go home  Objective:   Blood pressure 92/44, pulse 69, temperature 98.1 F  (36.7 C), temperature source Oral, resp. rate 17, height 5' 3.98" (1.625 m), weight 76.7 kg (169 lb 1.5 oz), SpO2 100 %.   Intake/Output Summary (Last 24 hours) at 07/15/14 1303 Last data filed at 07/15/14 1110  Gross per 24 hour  Intake    500 ml  Output    750 ml  Net   -250 ml    Exam Awake Alert, Oriented x 3, Normal affect [place].AT,PERRAL Supple Neck,No JVD, No cervical lymphadenopathy appriciated.  Symmetrical Chest wall movement, Good air movement bilaterally, CTAB RRR,No Gallops,Rubs or new Murmurs, No Parasternal Heave +ve B.Sounds,distended,NT  No rebound -guarding or rigidity. No Cyanosis, Clubbing or edema, No new Rash or bruise  Data Review   CBC w Diff: Lab Results  Component Value Date   WBC 6.3 07/14/2014   HGB 8.4* 07/14/2014   HCT 25.0* 07/14/2014   PLT 52* 07/14/2014   LYMPHOPCT 13.2 07/14/2014   MONOPCT 14.8 07/14/2014   EOSPCT 3.1 07/14/2014   BASOPCT 0.8 07/14/2014    CMP: Lab Results  Component Value Date   NA 140 07/15/2014   NA 142 09/24/2013   K 4.0 07/15/2014   K 3.8 09/24/2013   CL 116* 07/15/2014   CL 111* 09/24/2013   CO2 22 07/15/2014   CO2 21* 07/14/2014   BUN 22* 07/15/2014   BUN 27* 07/14/2014   CREATININE 0.89 07/15/2014   CREATININE 1.11* 07/14/2014   PROT 4.7* 07/14/2014   ALBUMIN 2.2* 07/14/2014   ALKPHOS 140* 07/14/2014   AST 50* 07/14/2014   ALT 37 07/14/2014  .   Total Time in preparing paper work, data evaluation and todays exam - 35 minutes

## 2014-07-15 NOTE — Progress Notes (Signed)
Pt is alert and oriented. Interpreter used this morning and to assess needs and at discharge. Physician aware of low BP. Pt is not symptomatic. Physician would still like to discharge pt home with PT. Prescription given to patient for new med. Pt given discharge instructions. Refused pneumonia vaccine. All belongings packed and took by son. Went to private car with both sons and nurse tech in wheel chair.  Pt and family verbalizes understanding of discharge instructions. Pt diet advanced and tolerated well. Foley out and voided post removal. NSR. No c/o pain

## 2014-07-15 NOTE — Progress Notes (Signed)
[facility]  Med Rec #  [id number]  [id number]

## 2014-07-15 NOTE — Consult Note (Addendum)
PATIENT NAME:  Annette Howard, Annette Howard#:  [id number] DATE OF BIRTH:  1955-08-16  DATE OF CONSULTATION:  07/12/2014  REFERRING PHYSICIAN:   CONSULTING PHYSICIAN:  Keturah Barre, NP  REASON FOR CONSULTATION: GI consult ordered by Dr. Nemiah Commander for evaluation of cirrhosis with hepatic encephalopathy.   HISTORY OF PRESENT ILLNESS:  Appreciate consult for 59 year old Hispanic woman with history of hepatitis C cirrhosis, gastric varices, anemia, chronic neck pain, diabetes, peptic ulcer disease for evaluation of hepatic encephalopathy and cirrhosis. The patient normally follows at [facility] now, daughter-in-law thinks it is Dr. Julieta Gutting.  The patient is intubated due to respiratory insufficiency, likely related to her hepatic encephalopathy, so her daughter-in-law and chart review aid with history taking. She was last  at [facility] yesterday for a paracentesis; about 6 liters of fluid removed per daughter-in-law's report. Has been requiring paracenteses on a weekly basis for the last month or so. She reports that patient was feeling very well yesterday after the procedure when she went home, had no unusual events over the course of the night, and has been very adherent to her lactulose and Xifaxan medications, Spironolactone, Lasix, and propranolol at home.  This morning the patient was found to have decreased mentation and EMS was called. The patient's daughter-in-law does   report the patient has some mild constipation but no known black tarry stools, rectal bleeding, or other GI issues.  Prior to paracentesis, had generalized abdominal discomfort with shortness of breath, but this improved after the procedure. She was seen last month in the Emergency Department here for altered mental status, was transferred to [facility], found to have constipation contributing to her altered mental status, treated with stool softeners with improvement, and released. Currently, her hemoglobin is 8.9, last month it was 11. She has a UTI  being treated with antibiotics. Renal function is somewhat decreased. She has no recent liver imaging here. CT head negative this visit. She is being treated with lactulose q. 4 hours, Rocephin, Levaquin, H2 receptor antagonist, and PPI therapy. Her cardiac enzymes have been negative. Her urine drug screen was negative. Acetaminophen and salicylates were negative,  ethanol less than 5, total bilirubin of 5.1, AST 80, ALT of 47, alkaline phosphatase 194, INR 1.6, PT 19.6, platelets 62,000, ammonia 257,  lactate was 2.4. She has been afebrile and hemodynamically stable on ventilatory support.   PAST MEDICAL HISTORY: Hepatitis C, liver cirrhosis, history of SBP MSSA bacteremia July 2015, chronic neck pain, gastric ulcer disease, gastric varices, chronic anemia, right eye blindness, hysterectomy, right eye surgery.   ALLERGIES: NKDA.   HOME MEDICATIONS: Propranolol 20 mg p.o. b.i.d., spironolactone 150 mg once a day, ondansetron q. 4 h. p.r.n., omeprazole 20 mg p.o. daily, Lasix 40 mg twice a day, lactulose 2 tablespoons twice a day, Xifaxan 550 mg p.o. b.i.d.   SOCIAL HISTORY: Lives with son. No history of tobacco, alcohol, illicits.   FAMILY HISTORY: [name], father with liver disease.   REVIEW OF SYSTEMS:  Unable to obtain from patient due to her being intubated on ventilator.   LABORATORY DATA: Most recent labs: Glucose 128. BNP 103, BUN 24, creatinine 1.07, GFR 57, sodium 136, potassium 5.5, chloride 109, calcium 7.6, magnesium 1.8, ammonia 257, lactate 2.4, ethanol less than 5, total protein 5.1, albumin 2.5, total bilirubin 5.1, ALP 194, AST 80, ALT 47. CPK MB troponin enzymes negative. Urine drug screen negative. WBC 6.8, hemoglobin 8.9, hematocrit 26.7, platelet count 62,000, red cells normocytic. PT 19.6, INR 1.6. Acetaminophen less than 10.  Salicylates less than 4.   CT head negative.   PHYSICAL EXAMINATION: VITAL SIGNS: Most recent: Temperature 98.3, pulse 72, respiratory rate 18, in  sync with ventilator, blood pressure 115/56, SaO2 of 100% on room air.  GENERAL: Critically ill-appearing woman in bed, appears comfortable on ventilator. She is not fighting the vent.  HEENT: Normocephalic, atraumatic. Right cornea distorted, left eye with 3 mm pupil, reacts  sluggishly to light.  Orogastric tube present.  NECK: Supple. No thyromegaly, JVD or bruits.  LUNGS: Respirations unlabored.  Faint coarse rhonchi on but mostly clear bilaterally.  CARDIAC: S1, S2, RRR. No MRG. No appreciable edema.  ABDOMEN: Bowel sounds x4. Some distention. Soft. Unable to appreciate any hepatosplenomegaly or masses or tenderness. Bowel sounds normal x4. There is no fluid wave or puddling.  SKIN: Warm, dry, somewhat jaundiced with pale patches consistent with vitiligo on her extremities.  NEUROLOGICAL: Lethargic, not  responsive, breathes in sync with vent. Pupils as noted above.  PSYCHIATRIC: Unable to assess at this time.   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  Hepatic encephalopathy exacerbation factors to consider for this include hypovolemia, possible bleeding, infection, declining renal status, constipation, HCC/vascular occlusion, and worsening liver disease. We will assess hemoglobin, stool for occult blood, ultrasound with Doppler studies, and [facility]. Recommend continuing to support hydration, optimizing nutrition, correct electrolytes, and observe for bleeding. Agree with current lactulose treatment and supportive care. Thank you very much for this consult. These services were provided by Vevelyn Pathristiane Tayten Bergdoll, MSN, [facility], in collaboration with Dr. Lutricia FeilPaul Oh, MD, with whom I discussed this patient in full.   ____________________________ Keturah Barrehristiane H. Nicholette Dolson, NP chl:tr D: 07/12/2014 17:11:40 ET T: 07/12/2014 17:36:41 ET JOB#: [id number]  cc: Keturah Barrehristiane H. Kynnadi Dicenso, NP, <Dictator> Eustaquio MaizeHRISTIANE H Valerye Kobus  FNP ELECTRONICALLY SIGNED 08/07/2014 12:57

## 2014-07-16 NOTE — Consult Note (Signed)
Details:   - GI Note: covering for Dr Bluford Kaufmannh.   Kathie RhodesSCarollee Leitz:  Menatal status much improved. Lot of liquid stool, requried rectal tube. Denies abd pain, rectal bleeding, melena  Exam VSS: a and o x 2, nad abd: nt, midl distension, nabs +Rectal tube: brwon liquid stool No asterixis  A/P:  1.). H.E; resovling on xifaxin and lactulose.  - cont xifaxin - cont holidng lactulose due to excessive diarrhea - avoid sedatin meds  2.) Diarrhea: likely from lactulose:  - cont holding lactulose - no empriirc abx at this time - watch lytes closely, Na trending up from diarrhea.   Electronic Signatures: Dow Adolphein, Matthew (MD)  (Signed 30-Apr-16 16:56)  Authored: Details   Last Updated: 30-Apr-16 16:56 by Dow Adolphein, Matthew (MD)

## 2016-01-14 IMAGING — CT CT HEAD WITHOUT CONTRAST
1 series · 16 of 28 positions shown, 20 images · non-contrast
Comparison: Head CT scan 08/16/2009.

CLINICAL DATA: Patient found unresponsive this morning.

EXAM:
CT HEAD WITHOUT CONTRAST
TECHNIQUE: Contiguous axial images were obtained from the base of the skull
through the vertex without intravenous contrast.

[Series 2: soft tissue · axial · 0.38mm/px · z∈[+1224,+1350]mm · 16 of 28 slices shown, 20 images]
[im 2/28  brain]
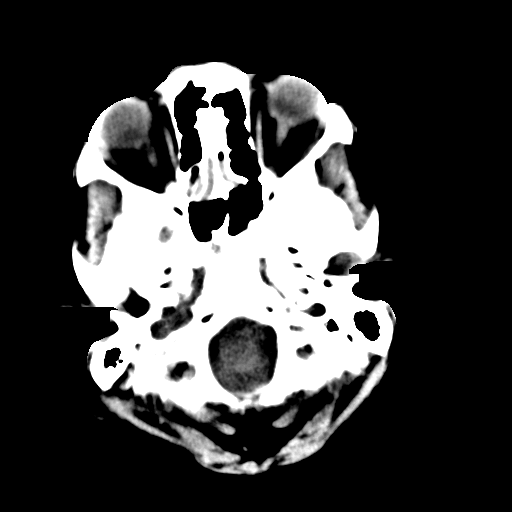
[im 2/28  bone]
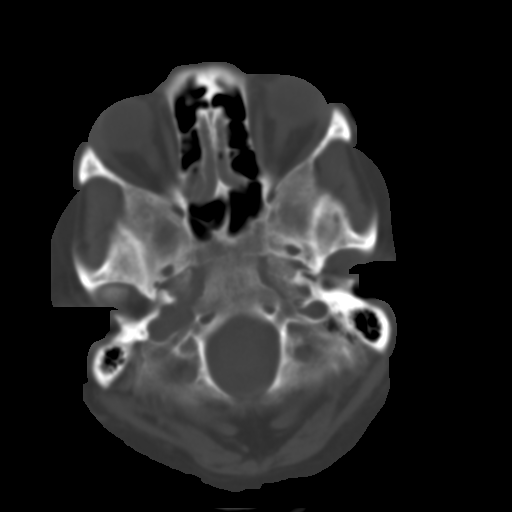
[im 4/28  brain]
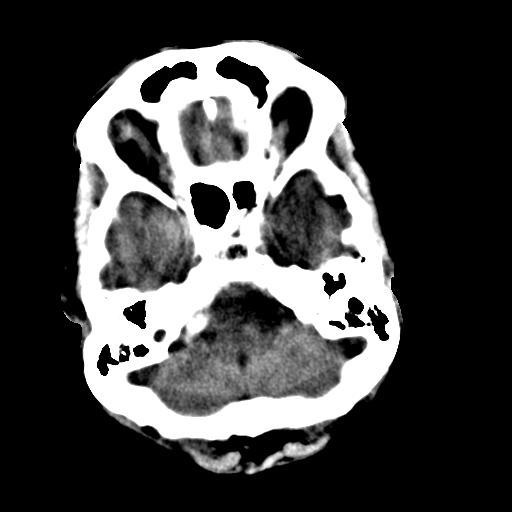
[im 6/28  brain]
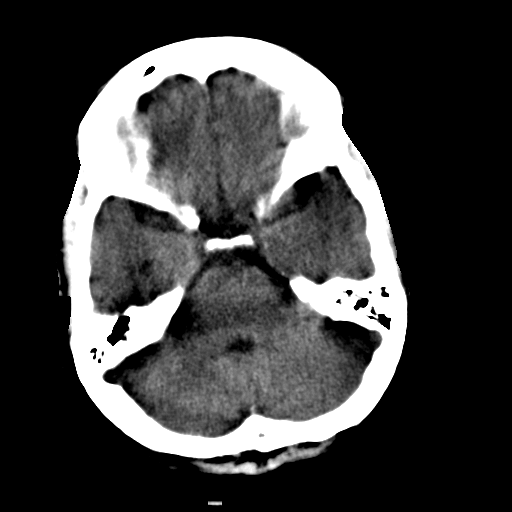
[im 7/28  brain]
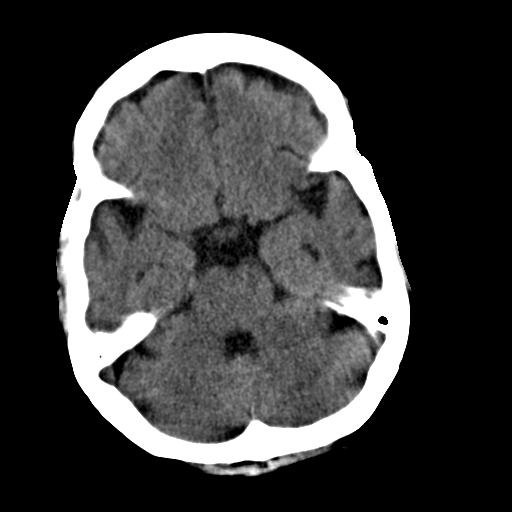
[im 9/28  brain]
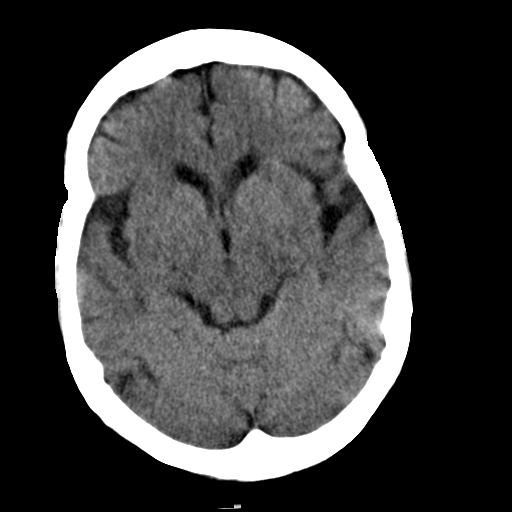
[im 9/28  bone]
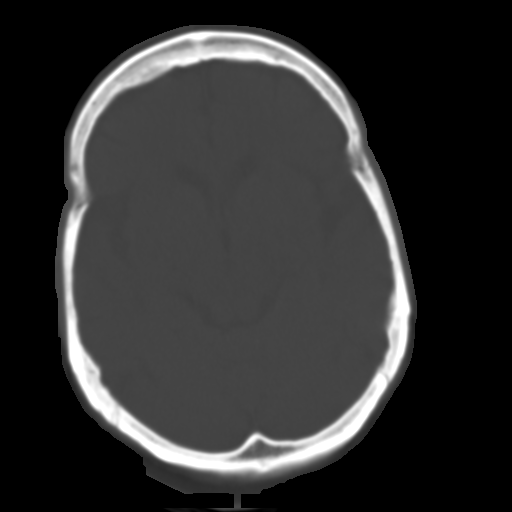
[im 10/28  brain]
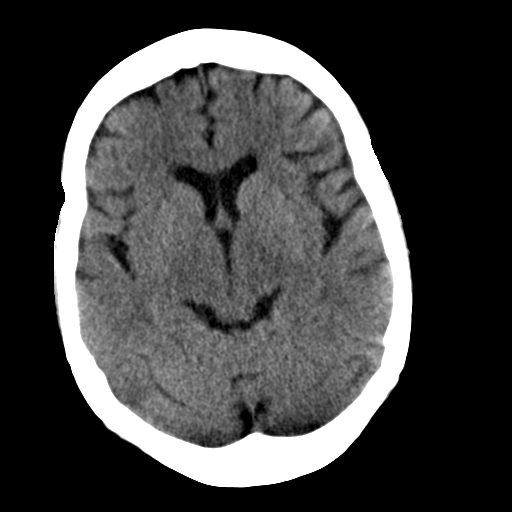
[im 12/28  brain]
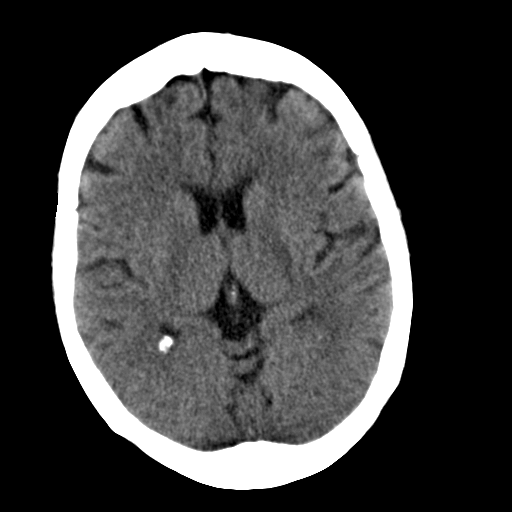
[im 14/28  brain]
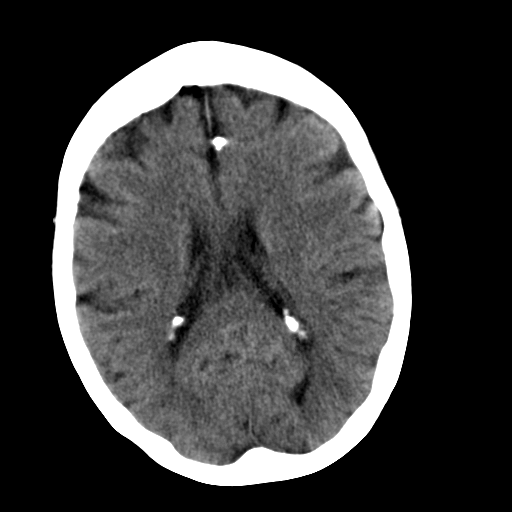
[im 15/28  brain]
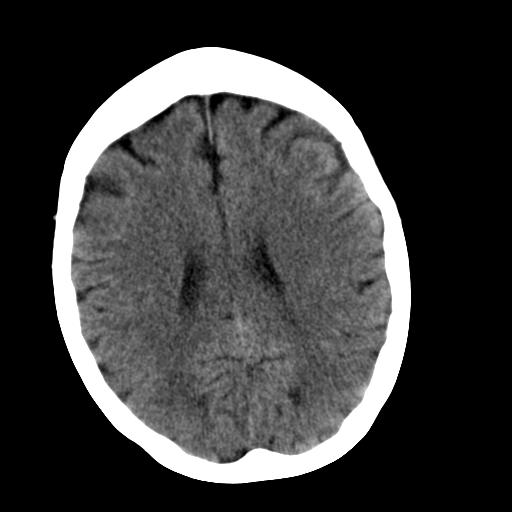
[im 15/28  bone]
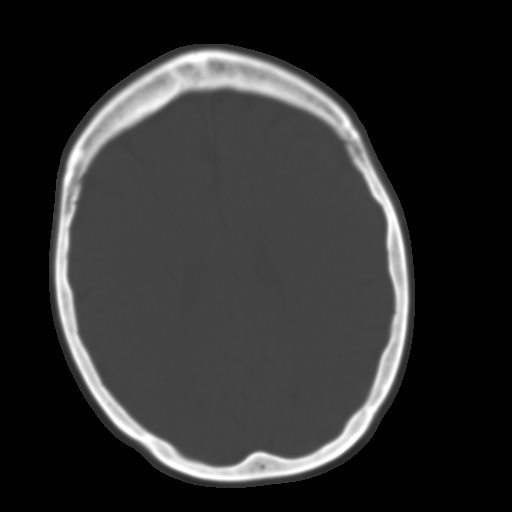
[im 17/28  brain]
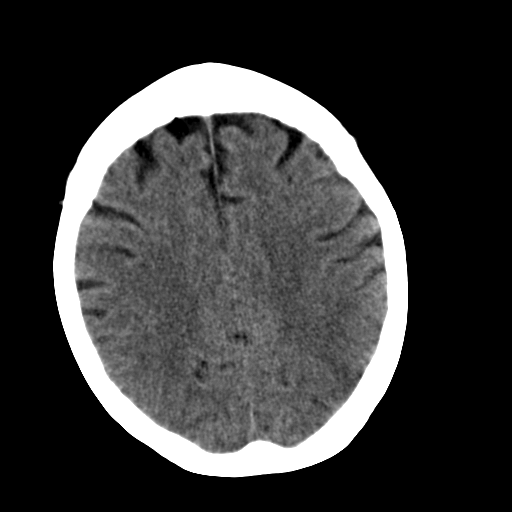
[im 19/28  brain]
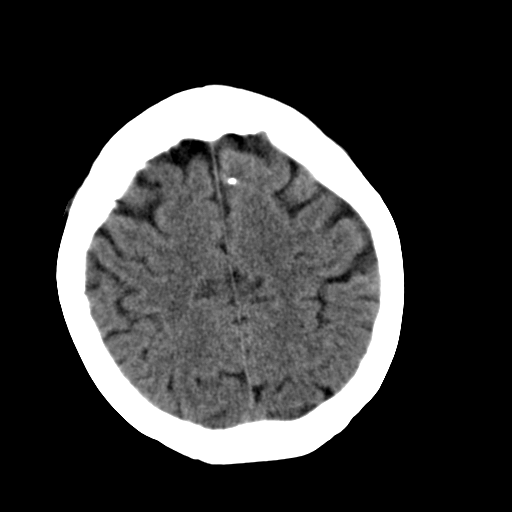
[im 20/28  brain]
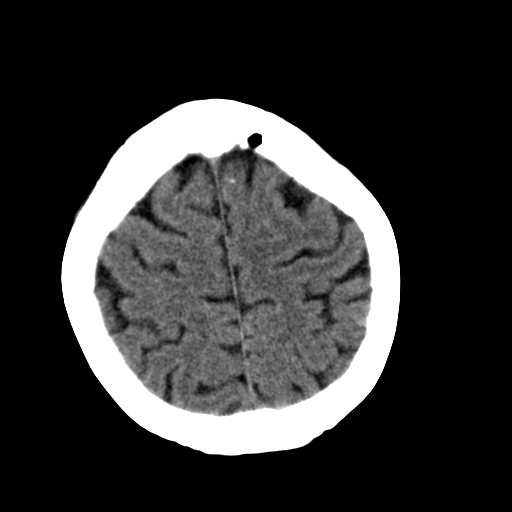
[im 22/28  brain]
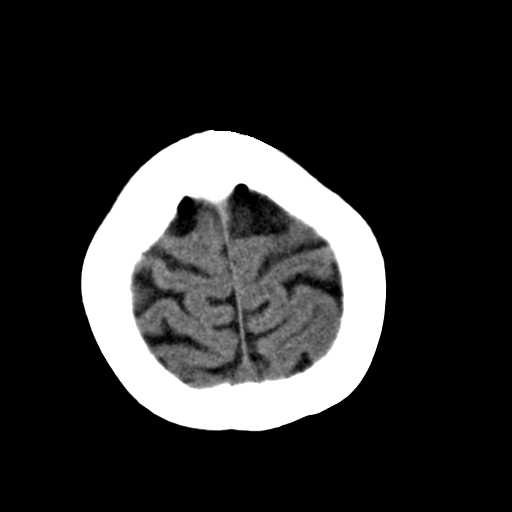
[im 22/28  bone]
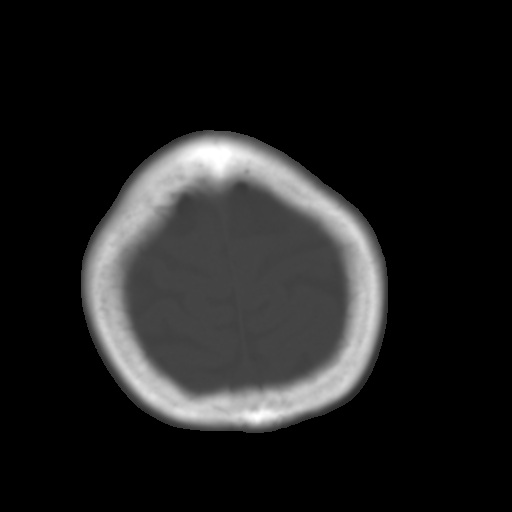
[im 23/28  brain]
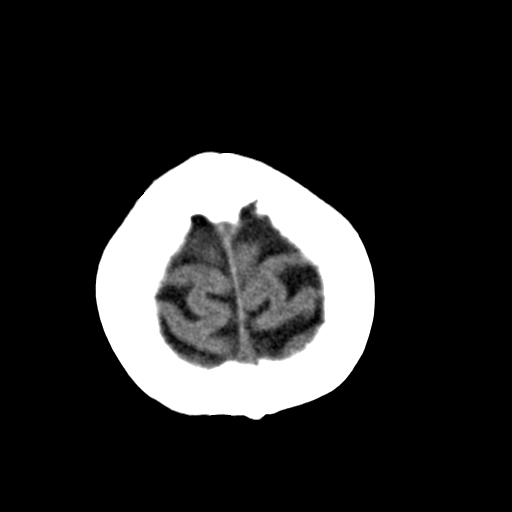
[im 25/28  brain]
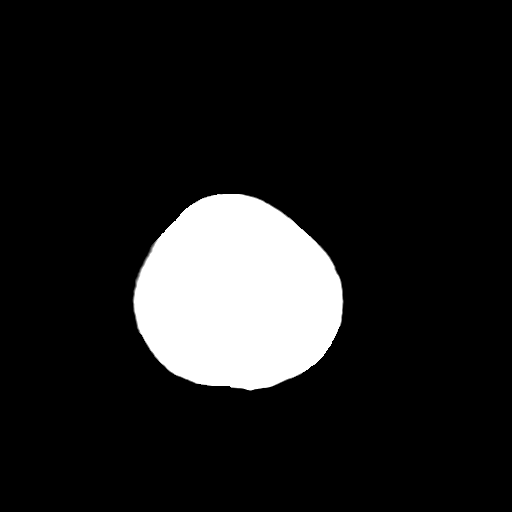
[im 27/28  brain]
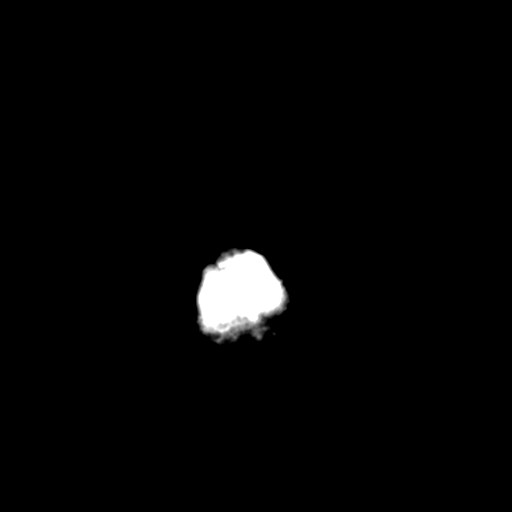

[16 of 28 positions shown; findings below may reference images not displayed]

FINDINGS: There is no evidence of acute intracranial abnormality including
hemorrhage, infarct, mass lesion, mass effect, midline shift or
abnormal extra-axial fluid collection. No hydrocephalus or
pneumocephalus. The calvarium is intact. Imaged paranasal sinuses
and mastoid air cells are unremarkable.
IMPRESSION: Negative exam.

## 2016-01-15 IMAGING — CR DG CHEST 1V PORT
1 series · 1 of 1 positions shown · non-contrast
Comparison: Chest x-ray of July 12, 2014

CLINICAL DATA: Follow-up of CHF, ventilated patient.

EXAM:
PORTABLE CHEST - 1 VIEW

[ap]
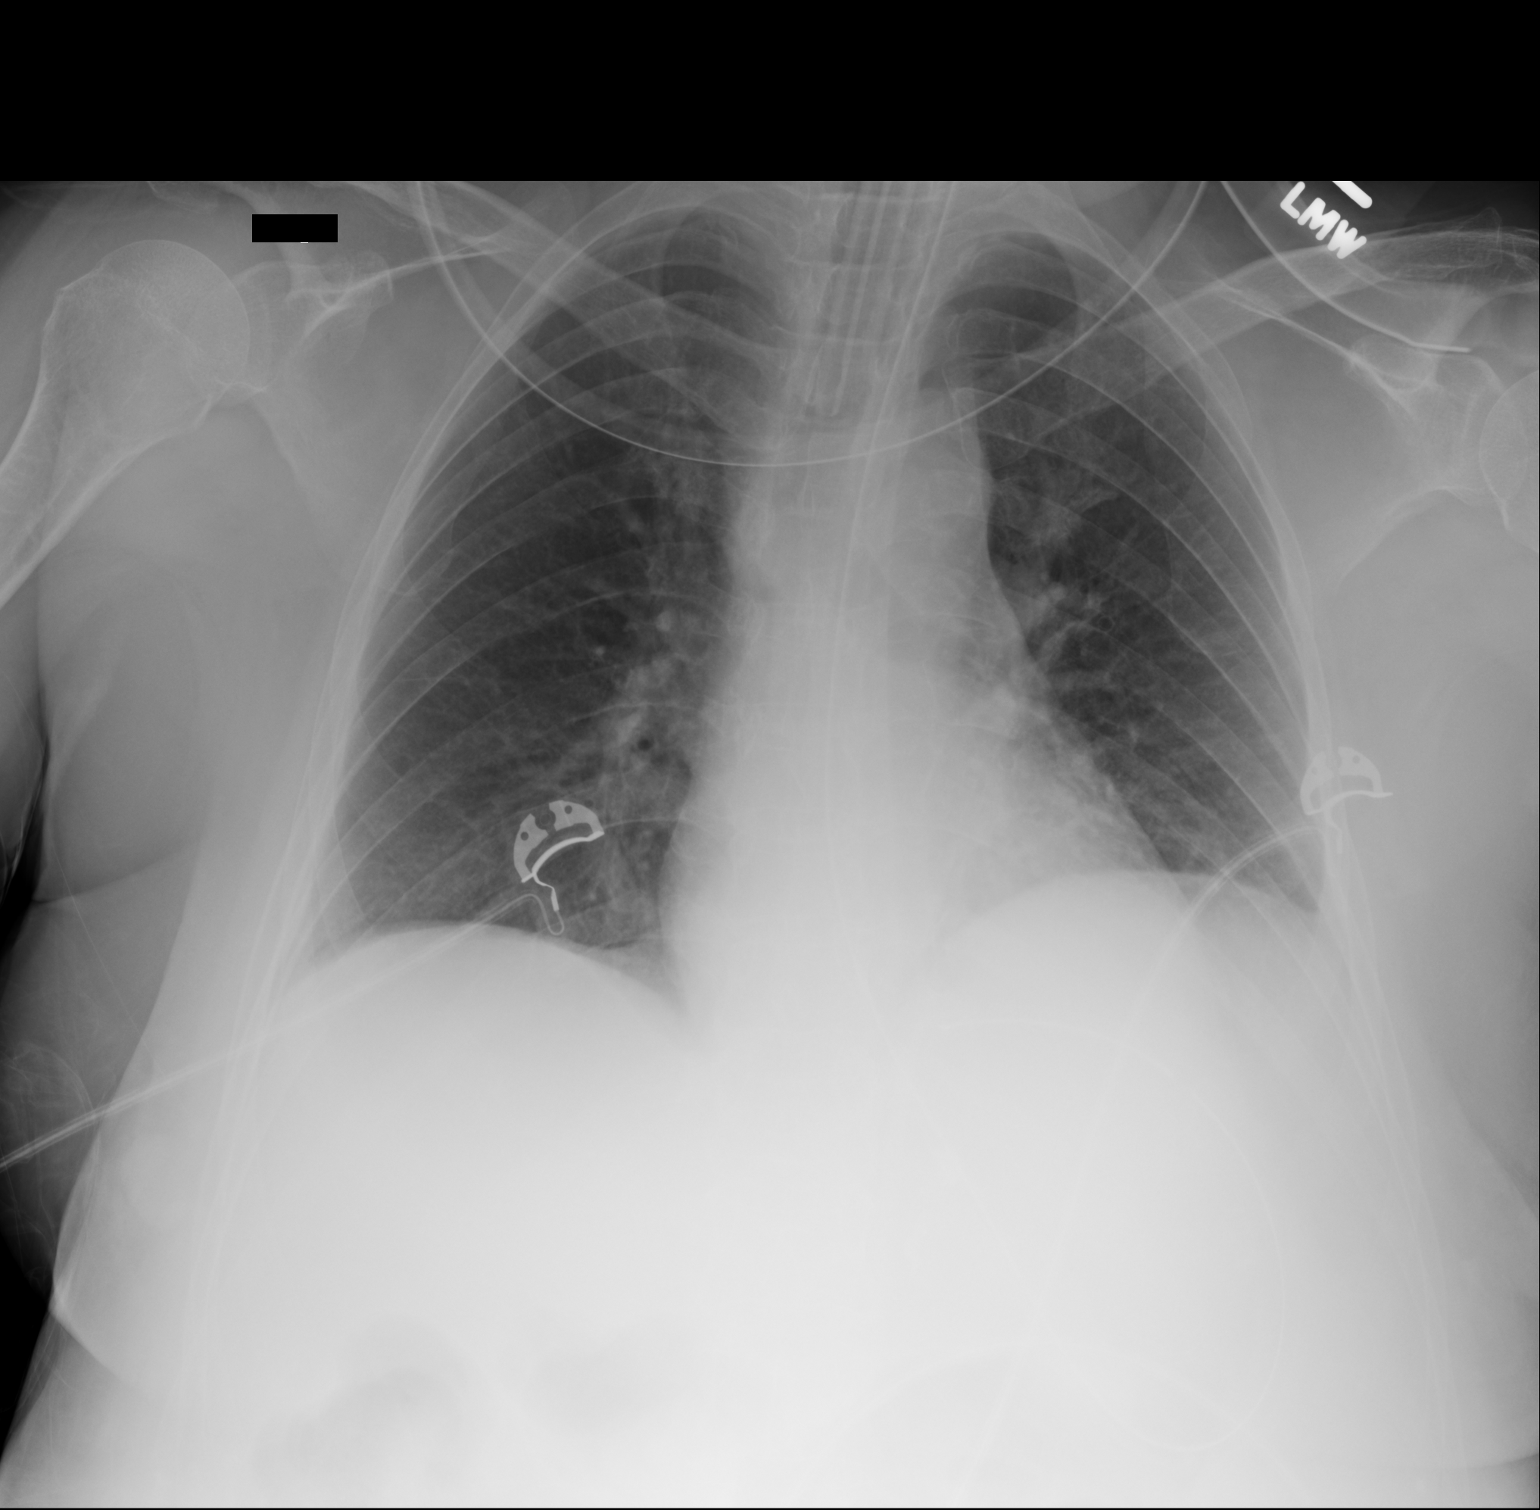

[1 of 1 positions shown; findings below may reference images not displayed]

FINDINGS: The lungs are better inflated today. The cardiac silhouette is
top-normal. The pulmonary vascularity is prominent centrally. There
is no significant interstitial edema. There is no pleural effusion
or pneumothorax. The endotracheal tube tip lies 3.7 cm above the
carina. The esophagogastric tube tip projects below the GE junction.
IMPRESSION: Improved aeration of both lungs with decreased interstitial
prominence. There is no pneumonia nor pulmonary edema. The support
tubes are in reasonable position.
